# Patient Record
Sex: Female | Born: 1970 | Race: Black or African American | Hispanic: No | Smoking: Light tobacco smoker
Health system: Southern US, Community
[De-identification: ages and names within clinical notes are randomized; demographics above are authoritative.]

## PROBLEM LIST (undated history)

## (undated) DIAGNOSIS — M549 Dorsalgia, unspecified: Secondary | ICD-10-CM

## (undated) DIAGNOSIS — I10 Essential (primary) hypertension: Secondary | ICD-10-CM

## (undated) DIAGNOSIS — O139 Gestational [pregnancy-induced] hypertension without significant proteinuria, unspecified trimester: Secondary | ICD-10-CM

## (undated) DIAGNOSIS — K219 Gastro-esophageal reflux disease without esophagitis: Secondary | ICD-10-CM

## (undated) DIAGNOSIS — G8929 Other chronic pain: Secondary | ICD-10-CM

## (undated) DIAGNOSIS — Z8759 Personal history of other complications of pregnancy, childbirth and the puerperium: Secondary | ICD-10-CM

## (undated) HISTORY — DX: Personal history of other complications of pregnancy, childbirth and the puerperium: Z87.59

## (undated) HISTORY — DX: Gastro-esophageal reflux disease without esophagitis: K21.9

## (undated) HISTORY — DX: Dorsalgia, unspecified: M54.9

## (undated) HISTORY — DX: Other chronic pain: G89.29

---

## 2000-12-21 ENCOUNTER — Emergency Department (HOSPITAL_COMMUNITY): Admission: EM | Admit: 2000-12-21 | Discharge: 2000-12-21 | Payer: Self-pay | Admitting: Internal Medicine

## 2002-04-25 HISTORY — PX: BREAST SURGERY: SHX581

## 2004-04-12 ENCOUNTER — Emergency Department (HOSPITAL_COMMUNITY): Admission: EM | Admit: 2004-04-12 | Discharge: 2004-04-12 | Payer: Self-pay | Admitting: Emergency Medicine

## 2004-06-27 ENCOUNTER — Emergency Department (HOSPITAL_COMMUNITY): Admission: EM | Admit: 2004-06-27 | Discharge: 2004-06-27 | Payer: Self-pay | Admitting: Emergency Medicine

## 2011-09-13 ENCOUNTER — Other Ambulatory Visit: Payer: Self-pay | Admitting: Nurse Practitioner

## 2011-09-13 ENCOUNTER — Other Ambulatory Visit (HOSPITAL_COMMUNITY)
Admission: RE | Admit: 2011-09-13 | Discharge: 2011-09-13 | Disposition: A | Discharge status: Home / Self Care | Payer: 59 | Source: Ambulatory Visit | Attending: Obstetrics and Gynecology | Admitting: Obstetrics and Gynecology

## 2011-09-13 DIAGNOSIS — Z01419 Encounter for gynecological examination (general) (routine) without abnormal findings: Secondary | ICD-10-CM | POA: Insufficient documentation

## 2011-09-13 DIAGNOSIS — Z1159 Encounter for screening for other viral diseases: Secondary | ICD-10-CM | POA: Insufficient documentation

## 2012-09-15 ENCOUNTER — Encounter (HOSPITAL_BASED_OUTPATIENT_CLINIC_OR_DEPARTMENT_OTHER): Payer: Self-pay | Admitting: Emergency Medicine

## 2012-09-15 ENCOUNTER — Observation Stay (HOSPITAL_BASED_OUTPATIENT_CLINIC_OR_DEPARTMENT_OTHER)
Admission: EM | Admit: 2012-09-15 | Discharge: 2012-09-16 | Disposition: A | Discharge status: Home / Self Care | Payer: Medicaid Other | Attending: Internal Medicine | Admitting: Internal Medicine

## 2012-09-15 DIAGNOSIS — R002 Palpitations: Secondary | ICD-10-CM

## 2012-09-15 DIAGNOSIS — I10 Essential (primary) hypertension: Secondary | ICD-10-CM

## 2012-09-15 DIAGNOSIS — R079 Chest pain, unspecified: Secondary | ICD-10-CM

## 2012-09-15 DIAGNOSIS — Z3490 Encounter for supervision of normal pregnancy, unspecified, unspecified trimester: Secondary | ICD-10-CM

## 2012-09-15 DIAGNOSIS — O99891 Other specified diseases and conditions complicating pregnancy: Secondary | ICD-10-CM | POA: Insufficient documentation

## 2012-09-15 DIAGNOSIS — R519 Headache, unspecified: Secondary | ICD-10-CM

## 2012-09-15 DIAGNOSIS — O169 Unspecified maternal hypertension, unspecified trimester: Principal | ICD-10-CM | POA: Insufficient documentation

## 2012-09-15 DIAGNOSIS — R0602 Shortness of breath: Secondary | ICD-10-CM

## 2012-09-15 DIAGNOSIS — R51 Headache: Secondary | ICD-10-CM

## 2012-09-15 DIAGNOSIS — R350 Frequency of micturition: Secondary | ICD-10-CM

## 2012-09-15 DIAGNOSIS — K5900 Constipation, unspecified: Secondary | ICD-10-CM

## 2012-09-15 HISTORY — DX: Essential (primary) hypertension: I10

## 2012-09-15 LAB — URINALYSIS, ROUTINE W REFLEX MICROSCOPIC
Glucose, UA: NEGATIVE mg/dL
Hgb urine dipstick: NEGATIVE
Leukocytes, UA: NEGATIVE
Protein, ur: NEGATIVE mg/dL
pH: 7 (ref 5.0–8.0)

## 2012-09-15 LAB — BASIC METABOLIC PANEL
BUN: 5 mg/dL — ABNORMAL LOW (ref 6–23)
CO2: 23 mEq/L (ref 19–32)
Chloride: 100 mEq/L (ref 96–112)
Creatinine, Ser: 0.6 mg/dL (ref 0.50–1.10)
GFR calc Af Amer: >90 mL/min (ref >90)
GFR calc non Af Amer: >90 mL/min (ref >90)
Glucose, Bld: 113 mg/dL — ABNORMAL HIGH (ref 70–99)
Potassium: 3.7 mEq/L (ref 3.5–5.1)
Sodium: 136 mEq/L (ref 135–145)

## 2012-09-15 LAB — TROPONIN I: Troponin I: <0.3 ng/mL (ref <0.30)

## 2012-09-15 LAB — CBC WITH DIFFERENTIAL/PLATELET
Basophils Absolute: 0 10*3/uL (ref 0.0–0.1)
Basophils Relative: 0 % (ref 0–1)
Eosinophils Absolute: 0.3 10*3/uL (ref 0.0–0.7)
Eosinophils Relative: 4 % (ref 0–5)
Hemoglobin: 13.2 g/dL (ref 12.0–15.0)
Lymphocytes Relative: 35 % (ref 12–46)
Lymphs Abs: 2.6 10*3/uL (ref 0.7–4.0)
MCH: 29.3 pg (ref 26.0–34.0)
MCV: 83.1 fL (ref 78.0–100.0)
Monocytes Absolute: 0.7 10*3/uL (ref 0.1–1.0)
Monocytes Relative: 10 % (ref 3–12)
Neutro Abs: 3.7 10*3/uL (ref 1.7–7.7)
Neutrophils Relative %: 51 % (ref 43–77)
Platelets: 383 10*3/uL (ref 150–400)
RBC: 4.51 MIL/uL (ref 3.87–5.11)
RDW: 14.9 % (ref 11.5–15.5)
WBC: 7.3 10*3/uL (ref 4.0–10.5)

## 2012-09-15 LAB — LIPID PANEL
Cholesterol: 159 mg/dL (ref 0–200)
LDL Cholesterol: 80 mg/dL (ref 0–99)
Total CHOL/HDL Ratio: 2.9 RATIO
VLDL: 24 mg/dL (ref 0–40)

## 2012-09-15 LAB — TSH: TSH: 1.487 u[IU]/mL (ref 0.350–4.500)

## 2012-09-15 LAB — CBC
HCT: 35 % — ABNORMAL LOW (ref 36.0–46.0)
MCV: 83.1 fL (ref 78.0–100.0)
Platelets: 356 10*3/uL (ref 150–400)
RBC: 4.21 MIL/uL (ref 3.87–5.11)
WBC: 8.2 10*3/uL (ref 4.0–10.5)

## 2012-09-15 LAB — CREATININE, SERUM
GFR calc Af Amer: >90 mL/min (ref >90)
GFR calc non Af Amer: >90 mL/min (ref >90)

## 2012-09-15 LAB — PREGNANCY, URINE: Preg Test, Ur: POSITIVE — AB

## 2012-09-15 LAB — HCG, QUANTITATIVE, PREGNANCY: hCG, Beta Chain, Quant, S: 82715 m[IU]/mL — ABNORMAL HIGH (ref <5)

## 2012-09-15 LAB — PROTIME-INR
INR: 0.97 (ref 0.00–1.49)
Prothrombin Time: 12.8 seconds (ref 11.6–15.2)

## 2012-09-15 MED ORDER — PRENATAL MULTIVITAMIN CH
1.0000 | ORAL_TABLET | Freq: Every day | ORAL | Status: DC
  Administered 2012-09-15: 1 via ORAL
  Filled 2012-09-15 (×2): qty 1

## 2012-09-15 MED ORDER — METOCLOPRAMIDE HCL 5 MG/ML IJ SOLN
10.0000 mg | Freq: Once | INTRAMUSCULAR | Status: AC
  Administered 2012-09-15: 10 mg via INTRAVENOUS
  Filled 2012-09-15: qty 2

## 2012-09-15 MED ORDER — ACETAMINOPHEN 500 MG PO TABS
1000.0000 mg | ORAL_TABLET | Freq: Once | ORAL | Status: AC
  Administered 2012-09-15: 500 mg via ORAL
  Filled 2012-09-15: qty 1

## 2012-09-15 MED ORDER — WHITE PETROLATUM GEL
Status: DC | PRN
  Administered 2012-09-15: 0.2 via TOPICAL
  Filled 2012-09-15: qty 28.35

## 2012-09-15 MED ORDER — SODIUM CHLORIDE 0.9 % IV BOLUS (SEPSIS)
1000.0000 mL | Freq: Once | INTRAVENOUS | Status: AC
  Administered 2012-09-15: 1000 mL via INTRAVENOUS

## 2012-09-15 MED ORDER — METOCLOPRAMIDE HCL 10 MG PO TABS
10.0000 mg | ORAL_TABLET | Freq: Four times a day (QID) | ORAL | Status: DC | PRN
  Filled 2012-09-15: qty 1

## 2012-09-15 MED ORDER — LABETALOL HCL 200 MG PO TABS
200.0000 mg | ORAL_TABLET | Freq: Two times a day (BID) | ORAL | Status: DC
  Administered 2012-09-15 – 2012-09-16 (×3): 200 mg via ORAL
  Filled 2012-09-15 (×4): qty 1

## 2012-09-15 MED ORDER — RANITIDINE HCL 150 MG/10ML PO SYRP
300.0000 mg | ORAL_SOLUTION | Freq: Every day | ORAL | Status: DC
  Administered 2012-09-15: 300 mg via ORAL
  Filled 2012-09-15 (×2): qty 20

## 2012-09-15 MED ORDER — WHITE PETROLATUM GEL
Status: DC | PRN
  Filled 2012-09-15: qty 5

## 2012-09-15 MED ORDER — POLYETHYLENE GLYCOL 3350 17 G PO PACK
17.0000 g | PACK | Freq: Every day | ORAL | Status: DC
  Administered 2012-09-15 – 2012-09-16 (×2): 17 g via ORAL
  Filled 2012-09-15 (×2): qty 1

## 2012-09-15 MED ORDER — SODIUM CHLORIDE 0.9 % IJ SOLN
3.0000 mL | Freq: Two times a day (BID) | INTRAMUSCULAR | Status: DC
  Administered 2012-09-15 (×2): 3 mL via INTRAVENOUS

## 2012-09-15 MED ORDER — ENOXAPARIN SODIUM 40 MG/0.4ML ~~LOC~~ SOLN
40.0000 mg | SUBCUTANEOUS | Status: DC
  Administered 2012-09-15: 40 mg via SUBCUTANEOUS
  Filled 2012-09-15 (×2): qty 0.4

## 2012-09-15 MED ORDER — COMPLETENATE 29-1 MG PO CHEW
1.0000 | CHEWABLE_TABLET | Freq: Every day | ORAL | Status: DC
  Administered 2012-09-15: 1 via ORAL
  Filled 2012-09-15: qty 1

## 2012-09-15 MED ORDER — DIPHENHYDRAMINE HCL 50 MG/ML IJ SOLN
12.5000 mg | Freq: Once | INTRAMUSCULAR | Status: AC
  Administered 2012-09-15: 12.5 mg via INTRAVENOUS
  Filled 2012-09-15: qty 1

## 2012-09-15 MED ORDER — ACETAMINOPHEN 325 MG PO TABS
650.0000 mg | ORAL_TABLET | ORAL | Status: DC | PRN
  Administered 2012-09-15: 650 mg via ORAL
  Filled 2012-09-15: qty 2

## 2012-09-15 NOTE — Progress Notes (Signed)
Request for transfer to [facility], chest pain rule out and headache, pt with HTN, [redacted] weeks pregnant. Pt has no PCP or OB/GYN. Asked for telemetry bed.   Debbora Presto, MD  [facility] Pager [phone]  If 7PM-7AM, please contact night-coverage [email or web address] Password [email or web address]

## 2012-09-15 NOTE — H&P (Addendum)
[facility] History and Physical  Jacqueline Rowe ZOX:[id number] DOB: 16-Feb-1971 DOA: 09/15/2012  Referring physician:  April Palumbo-Rasch PCP:  No primary provider on file.   Chief Complaint:  Headache,  Left face pain, and palpations  HPI:  The patient is a 42 y.o. year-old female with history of hypertension who presents with episodic headache, left face pain, chest pains and palpitations.  The patient was last at their baseline health 2 weeks ago.  The patient states that about 3 weeks ago she found out that she was pregnant. At that time she stopped her Diovan and did not take any other blood pressure medications because she does not have a primary care doctor. She started having intermittent posterior head headaches which she attributed to high blood pressure. When she would get a headache she would take a dose of Diovan and her headache would resolve.  Yesterday, her headache returned so she took some tylenol.  Her headache went away for about 30 minutes then returned worse than before 10/10 with shortness of breath, nausea, but no diaphoresis.  She then took the diovan and her headache resolved.  Overnight, she was woken up from sleep with 10/10 sharp pains on the left side of the head that radiated down her left neck and left shoulder.  Denies associated chest pain, SOB, nausea, but did have diaphoresis and palpitations.  She went to the emergency department where she was given IVF and benadryl and she is being admitted to rule out ACS.    Review of Systems:  Denies fevers, chills, weight loss or gain, changes to hearing and vision.  Denies rhinorrhea, sinus congestion, sore throat.  Denies cough.  Denies vomiting, diarrhea.  Denies dysuria.  Has urinary frequency.  Denies urgency, polyuria, polydipsia.  Denies hematemesis, blood in stools, melena, abnormal bruising or bleeding.  Denies lymphadenopathy.  Denies arthralgias, myalgias.  Denies skin rash or ulcer.  Denies lower extremity  edema.  Denies focal numbness, weakness, slurred speech, confusion, facial droop.  Denies anxiety and depression.    Past Medical History  Diagnosis Date  . Hypertension    Past Surgical History  Procedure Laterality Date  . Orif radial fracture     Social History:  reports that she quit smoking about 3 weeks ago. Her smoking use included Cigarettes. She has a 3.75 pack-year smoking history. She does not have any smokeless tobacco history on file. She reports that  drinks alcohol. She reports that she does not use illicit drugs.  Lives in an apartment with her fiance and dog.  Does not work, currently unemployed.  Collections/medical billing/customer service previously.    No Known Allergies  Family History  Problem Relation Age of Onset  . Migraines Neg Hx   . High blood pressure Mother   . High blood pressure Maternal Aunt   . High blood pressure Sister      Prior to Admission medications   Medication Sig Start Date End Date Taking? Authorizing Provider  valsartan (DIOVAN) 160 MG tablet Take 160 mg by mouth daily.   Yes Historical Provider, MD   Physical Exam: Filed Vitals:   09/15/12 0631 09/15/12 0829 09/15/12 1122 09/15/12 1355  BP:  122/49 151/65 118/88  Pulse:  75 73 76  Temp: 98.2 F (36.8 C)  98.4 F (36.9 C) 98.8 F (37.1 C)  TempSrc:   Oral Oral  Resp:  17 18 20   Height:   5\' 7"  (1.702 m)   Weight:   121.4 kg (267 lb  10.2 oz)   SpO2:  100% 100% 98%     General:  African American female, no acute distress  Eyes:  PERRL, anicteric, non-injected.  ENT:  Nares clear.  OP clear, non-erythematous without plaques or exudates.  MMM.  Neck:  Supple without TM or JVD.    Lymph:  No cervical, supraclavicular, or submandibular LAD.  Cardiovascular:  RRR, normal S1, S2, without m/r/g.  2+ pulses, warm extremities  Respiratory:  CTA bilaterally without increased WOB.  Abdomen:  NABS.  Soft, ND/NT.    Skin:  No rashes or focal lesions.  No vesicles on the left  face, shoulder or neck  Musculoskeletal:  Normal bulk and tone.  No LE edema.  Psychiatric:  A & O x 4.  Appropriate affect.  Neurologic:  CN 3-12 intact.  5/5 strength.  Sensation intact.  Labs on Admission:  Basic Metabolic Panel:  Recent Labs Lab 09/15/12 0550 09/15/12 1341  NA 136  --   K 3.7  --   CL 100  --   CO2 23  --   GLUCOSE 113*  --   BUN 5*  --   CREATININE 0.60 0.58  CALCIUM 9.4  --    Liver Function Tests: No results found for this basename: AST, ALT, ALKPHOS, BILITOT, PROT, ALBUMIN,  in the last 168 hours No results found for this basename: LIPASE, AMYLASE,  in the last 168 hours No results found for this basename: AMMONIA,  in the last 168 hours CBC:  Recent Labs Lab 09/15/12 0550 09/15/12 1341  WBC 7.3 8.2  NEUTROABS 3.7  --   HGB 13.2 12.3  HCT 37.5 35.0*  MCV 83.1 83.1  PLT 383 356   Cardiac Enzymes:  Recent Labs Lab 09/15/12 0550 09/15/12 1345  TROPONINI <0.30 <0.30    BNP (last 3 results) No results found for this basename: PROBNP,  in the last 8760 hours CBG: No results found for this basename: GLUCAP,  in the last 168 hours  Radiological Exams on Admission: No results found.  EKG: Independently reviewed. Normal sinus rhythm with isolated T-wave inversion in lead 3  Assessment/Plan Active Problems:   HTN (hypertension)   Headache   Pregnancy   Urinary frequency   Palpitations   Shortness of breath  Headache:  Differential diagnosis includes tension headaches, migraine headache, cervical spine nerve impingement.  Pain improved after Reglan and Benadryl -  Per OB/GYN Reglan and Benadryl are okay for headache treatment -  Continue Tylenol and Reglan when necessary  Palpitations and shortness of breath: -  EKG:  Normal sinus rhythm -  Telemetry -  Troponins -  D-dimer: Negative -  TSH -  Lipid panel -  Hemoglobin A1c  Urinary frequency, likely benign from pregnancy -  Urinalysis and urine culture  Hypertension,  blood pressures elevated -  Start labetalol -  Continue Diovan  Pregnancy -  Start multivitamin  Constipation  -  Start Colace and MiraLax when necessary   Diet:  Healthy heart Access:  PIV IVF:  OFF Proph:  lovenox  Code Status: full Family Communication: spoke with patient and fiance Disposition Plan: Likely home tomorrow  Time spent: 60 minutes  Renae Fickle [facility] Pager [phone]  If 7PM-7AM, please contact night-coverage [email or web address] Password [facility] 09/15/2012, 6:25 PM

## 2012-09-15 NOTE — ED Notes (Signed)
Pt reports she was awaken from sleep by shooting pain in the left side of head radating down to her chest. Pt states she is 2 months nine-weeks preg and stop taking her BP med d/t pregnancy

## 2012-09-15 NOTE — Progress Notes (Signed)
Pt c/o of headache dull and behind eyes, requested Tylenol. Reports H/A not like the one that brought [facility]. B/P 118/88

## 2012-09-15 NOTE — ED Notes (Signed)
I met patient at waiting room, patient complained of sharp neck and head pain running down left arm. I took patient to room, got vitals and completed an ecg, notified nurse.

## 2012-09-15 NOTE — Plan of Care (Signed)
Problem: Phase I Progression Outcomes Goal: Anginal pain relieved Outcome: Completed/Met Date Met:  09/15/12 C/o chest pain shortly after MD assessment completed. Pt presents in NAD. Talking calmly and laughing with phlebotomist who is drawing blood. B/P 118/88 HR76  EKG with no changes and WNL in comparison to previous EKG. Upon return to room pt reports just a slight twinge in LUC.

## 2012-09-15 NOTE — ED Provider Notes (Addendum)
History     CSN: [id number]  Arrival date & time 09/15/12  0509   First MD Initiated Contact with Patient 09/15/12 [phone]      Chief Complaint  Patient presents with  . Headache    with arm and neck pain    (Consider location/radiation/quality/duration/timing/severity/associated sxs/prior treatment) Patient is a 42 y.o. female presenting with headaches and chest pain. The history is provided by the patient.  Headache Pain location:  L temporal Quality:  Stabbing Radiates to:  L neck and L arm Severity currently:  6/10 Severity at highest:  6/10 Timing:  Constant Progression:  Improving Chronicity:  New Context: not defecating   Relieved by:  Nothing Worsened by:  Nothing tried Ineffective treatments:  None tried Associated symptoms: nausea   Associated symptoms: no abdominal pain, no ear pain, no fever, no loss of balance, no neck stiffness, no paresthesias, no photophobia, no sore throat and no weakness   Risk factors: no family hx of SAH   Chest Pain Pain location:  L chest Pain radiates to:  L arm Pain radiates to the back: no   Pain severity:  Moderate Onset quality:  Sudden Timing:  Constant Progression:  Unchanged Chronicity:  New Context: at rest   Relieved by:  Nothing Worsened by:  Nothing tried Ineffective treatments:  None tried Associated symptoms: headache, nausea and shortness of breath   Associated symptoms: no abdominal pain, no fever and no palpitations   Risk factors: smoking     History reviewed. No pertinent past medical history.  No past surgical history on file.  No family history on file.  History  Substance Use Topics  . Smoking status: Not on file  . Smokeless tobacco: Not on file  . Alcohol Use: Not on file    OB History   Grav Para Term Preterm Abortions TAB SAB Ect Mult Living                  Review of Systems  Constitutional: Negative for fever.  HENT: Negative for ear pain, sore throat and neck stiffness.   Eyes:  Negative for photophobia.  Respiratory: Positive for shortness of breath.   Cardiovascular: Positive for chest pain. Negative for palpitations and leg swelling.  Gastrointestinal: Positive for nausea. Negative for abdominal pain.  Neurological: Positive for headaches. Negative for paresthesias and loss of balance.  All other systems reviewed and are negative.   Date: 09/15/2012  Rate: 77  Rhythm: normal sinus rhythm  QRS Axis: normal  Intervals: normal  ST/T Wave abnormalities: normal  Conduction Disutrbances: none  Narrative Interpretation: unremarkable      Allergies  Review of patient's allergies indicates not on file.  Home Medications  No current outpatient prescriptions on file.  BP 157/74  Pulse 88  Temp(Src) 98.4 F (36.9 C) (Oral)  Resp 20  SpO2 100%  Physical Exam  Constitutional: She is oriented to person, place, and time. She appears well-developed and well-nourished. No distress.  HENT:  Head: Normocephalic and atraumatic.  Right Ear: No mastoid tenderness. No hemotympanum.  Left Ear: No mastoid tenderness. No hemotympanum.  Mouth/Throat: Oropharynx is clear and moist.  Eyes: Conjunctivae and EOM are normal. Pupils are equal, round, and reactive to light.  Neck: Normal range of motion. Neck supple. No JVD present. No tracheal deviation present.  Cardiovascular: Normal rate, regular rhythm and intact distal pulses.   Pulmonary/Chest: Effort normal and breath sounds normal. No stridor. She has no wheezes. She has no rales. She exhibits  no tenderness.  Abdominal: Soft. Bowel sounds are normal. There is no tenderness. There is no rebound and no guarding.  Musculoskeletal: Normal range of motion. She exhibits no edema and no tenderness.  Neurological: She is alert and oriented to person, place, and time. She has normal reflexes. She displays normal reflexes. No cranial nerve deficit. Coordination normal.  5/5 strength throughout  Skin: Skin is warm and dry.   Psychiatric: She has a normal mood and affect.    ED Course  Procedures (including critical care time)  Labs Reviewed  CBC WITH DIFFERENTIAL  BASIC METABOLIC PANEL  TROPONIN I  PROTIME-INR  PREGNANCY, URINE  HCG, QUANTITATIVE, PREGNANCY   No results found.   No diagnosis found.    MDM  Case d/w Dr. Thad Ranger who given the symptoms are all unilateral would not CT head at this time. Dissection in the neck would have contralateral symptoms.  Without neurologic deficits patient can be watched as the risk of pathology is low.   If symptoms persist or patient is risk averse MRI of the brain.  Patient adamantly refusing all imaging at this time.  Patient's nurse Reita Cliche present.    Patient reports thinking BP was elevated so she took a diaovan at home and symptoms are improving   Case d/w Dr, Janee Morn of GYN reglan and benadryl are same for treatment of migraine can consider flexeril or fiorcet    Date: 09/15/2012  Rate: 77  Rhythm: normal sinus rhythm  QRS Axis: normal  Intervals: normal  ST/T Wave abnormalities: normal  Conduction Disutrbances: none  Narrative Interpretation: unremarkable    Patient admitted to medicine for rule out  Alyrica Thurow K Kaylee Trivett-Rasch, MD 09/15/12 0707  Jasmine Awe, MD 09/15/12 [phone]

## 2012-09-16 LAB — TROPONIN I: Troponin I: <0.3 ng/mL (ref <0.30)

## 2012-09-16 LAB — URINE CULTURE: Colony Count: 55000

## 2012-09-16 MED ORDER — LABETALOL HCL 200 MG PO TABS: 200.0000 mg | ORAL_TABLET | Freq: Two times a day (BID) | ORAL | Status: DC

## 2012-09-16 MED ORDER — POLYETHYLENE GLYCOL 3350 17 G PO PACK: 17.0000 g | PACK | Freq: Every day | ORAL | Status: DC

## 2012-09-16 MED ORDER — METOCLOPRAMIDE HCL 10 MG PO TABS: 10.0000 mg | ORAL_TABLET | Freq: Four times a day (QID) | ORAL | Status: DC | PRN

## 2012-09-16 MED ORDER — PRENATAL MULTIVITAMIN CH: 1.0000 | ORAL_TABLET | Freq: Every day | ORAL | Status: DC

## 2012-09-16 NOTE — Discharge Summary (Signed)
Physician Discharge Summary  Jacqueline Rowe ZOX:[id number] DOB: 01-17-1971 DOA: 09/15/2012  PCP: No primary provider on file.  Admit date: 09/15/2012 Discharge date: 09/16/2012  Recommendations for Outpatient Follow-up:  1. Followup with obstetrician in one week.  Discharge Diagnoses:  Active Problems:   HTN (hypertension)   Headache   Pregnancy   Urinary frequency   Palpitations   Shortness of breath   Unspecified constipation   Discharge Condition: Stable, improved  Diet recommendation: Healthy heart  Wt Readings from Last 3 Encounters:  09/16/12 122 kg (268 lb 15.4 oz)    History of present illness:  The patient is a 42 y.o. year-old female with history of hypertension who presents with episodic headache, left face pain, chest pains and palpitations. The patient was last at their baseline health 2 weeks ago. The patient states that about 3 weeks ago she found out that she was pregnant. At that time she stopped her Diovan and did not take any other blood pressure medications because she does not have a primary care doctor. She started having intermittent posterior head headaches which she attributed to high blood pressure. When she would get a headache she would take a dose of Diovan and her headache would resolve. Yesterday, her headache returned so she took some tylenol. Her headache went away for about 30 minutes then returned worse than before 10/10 with shortness of breath, nausea, but no diaphoresis. She then took the diovan and her headache resolved. Overnight, she was woken up from sleep with 10/10 sharp pains on the left side of the head that radiated down her left neck and left shoulder. Denies associated chest pain, SOB, nausea, but did have diaphoresis and palpitations. She went to the emergency department where she was given IVF and benadryl and she is being admitted to rule out ACS.   Hospital Course:   Headache: Differential diagnosis includes tension headaches,  migraine headache, cervical spine nerve impingement.  The case was discussed with neurology.  They did not recommend any further testing or imaging. Pain improved after Reglan and Benadryl.  She will be given a prescription for Reglan to use as needed for headache at home.    Palpitations and shortness of breath:   - EKG: Normal sinus rhythm  - Telemetry:  NSR - Troponins:  Neg x 3 - D-dimer: Negative  - TSH 1.487 - Lipid panel, cholesterol at goal - Hemoglobin A1c 5.7  Urinary frequency, likely benign from pregnancy  - Urinalysis negative and urine culture is pending  Hypertension, blood pressures elevated.  Recommended that she discontinue her Diovan and start labetalol.  Her blood pressures were within normal limits to slightly elevated on twice a day labetalol.  She may need to have her dose adjusted by her obstetrician in one to 2 weeks.  If able she should check her blood pressure as an outpatient and record the readings and bring with her to her followup appointment.   Pregnancy.  She was started on a prenatal vitamin.  Constipation.  Recommended Colace and MiraLax.  Procedures:  None  Consultations:  Case discussed with obstetrician and neurologist  Discharge Exam: Filed Vitals:   09/16/12 0621  BP: 125/54  Pulse: 69  Temp: 98.2 F (36.8 C)  Resp: 18   Filed Vitals:   09/15/12 1122 09/15/12 1355 09/15/12 2133 09/16/12 0621  BP: 151/65 118/88 136/64 125/54  Pulse: 73 76 66 69  Temp: 98.4 F (36.9 C) 98.8 F (37.1 C) 98.7 F (37.1 C) 98.2 F (36.8  C)  TempSrc: Oral Oral Oral Oral  Resp: 18 20 18 18   Height: 5\' 7"  (1.702 m)     Weight: 121.4 kg (267 lb 10.2 oz)   122 kg (268 lb 15.4 oz)  SpO2: 100% 98% 100% 100%   Patient states that her chest pain and headache resolved.  Did not have any palpitations.  General: African American female, no acute distress  HEENT: NCAT, MMM  Cardiovascular: RRR, normal S1, S2, without m/r/g. 2+ pulses, warm extremities   Respiratory: CTA bilaterally without increased WOB.  Abdomen: NABS. Soft, ND/NT.  Skin: No rashes or focal lesions. No vesicles on the left face, shoulder or neck  Musculoskeletal: Normal bulk and tone. No LE edema.    Discharge Instructions     Medication List    STOP taking these medications       valsartan 160 MG tablet  Commonly known as:  DIOVAN      TAKE these medications       labetalol 200 MG tablet  Commonly known as:  NORMODYNE  Take 1 tablet (200 mg total) by mouth 2 (two) times daily.     metoCLOPramide 10 MG tablet  Commonly known as:  REGLAN  Take 1 tablet (10 mg total) by mouth every 6 (six) hours as needed (headache).     polyethylene glycol packet  Commonly known as:  MIRALAX / GLYCOLAX  Take 17 g by mouth daily.     prenatal multivitamin Tabs  Take 1 tablet by mouth daily at 12 noon.     ranitidine 300 MG tablet  Commonly known as:  ZANTAC  Take 300 mg by mouth at bedtime.       Follow-up Information   Follow up with Obstetrician.   Contact information:   Please call the clinic you have chosen to schedule an appointment for as soon as possible.        The results of significant diagnostics from this hospitalization (including imaging, microbiology, ancillary and laboratory) are listed below for reference.    Significant Diagnostic Studies: No results found.  Microbiology: No results found for this or any previous visit (from the past 240 hour(s)).   Labs: Basic Metabolic Panel:  Recent Labs Lab 09/15/12 0550 09/15/12 1341  NA 136  --   K 3.7  --   CL 100  --   CO2 23  --   GLUCOSE 113*  --   BUN 5*  --   CREATININE 0.60 0.58  CALCIUM 9.4  --    Liver Function Tests: No results found for this basename: AST, ALT, ALKPHOS, BILITOT, PROT, ALBUMIN,  in the last 168 hours No results found for this basename: LIPASE, AMYLASE,  in the last 168 hours No results found for this basename: AMMONIA,  in the last 168 hours CBC:  Recent  Labs Lab 09/15/12 0550 09/15/12 1341  WBC 7.3 8.2  NEUTROABS 3.7  --   HGB 13.2 12.3  HCT 37.5 35.0*  MCV 83.1 83.1  PLT 383 356   Cardiac Enzymes:  Recent Labs Lab 09/15/12 0550 09/15/12 1345 09/15/12 1829 09/16/12 0012  TROPONINI <0.30 <0.30 <0.30 <0.30   BNP: BNP (last 3 results) No results found for this basename: PROBNP,  in the last 8760 hours CBG: No results found for this basename: GLUCAP,  in the last 168 hours  Time coordinating discharge: 45 minutes  Signed:  Iasiah Ozment  [facility] 09/16/2012, 7:56 AM

## 2013-11-20 ENCOUNTER — Encounter (HOSPITAL_BASED_OUTPATIENT_CLINIC_OR_DEPARTMENT_OTHER): Payer: Self-pay | Admitting: Emergency Medicine

## 2013-11-20 ENCOUNTER — Emergency Department (HOSPITAL_BASED_OUTPATIENT_CLINIC_OR_DEPARTMENT_OTHER): Payer: Commercial Managed Care - PPO

## 2013-11-20 ENCOUNTER — Emergency Department (HOSPITAL_BASED_OUTPATIENT_CLINIC_OR_DEPARTMENT_OTHER)
Admission: EM | Admit: 2013-11-20 | Discharge: 2013-11-20 | Disposition: A | Discharge status: Home / Self Care | Payer: Commercial Managed Care - PPO | Attending: Emergency Medicine | Admitting: Emergency Medicine

## 2013-11-20 DIAGNOSIS — R1012 Left upper quadrant pain: Secondary | ICD-10-CM | POA: Insufficient documentation

## 2013-11-20 DIAGNOSIS — O99330 Smoking (tobacco) complicating pregnancy, unspecified trimester: Secondary | ICD-10-CM | POA: Insufficient documentation

## 2013-11-20 DIAGNOSIS — O9989 Other specified diseases and conditions complicating pregnancy, childbirth and the puerperium: Secondary | ICD-10-CM | POA: Insufficient documentation

## 2013-11-20 DIAGNOSIS — Z79899 Other long term (current) drug therapy: Secondary | ICD-10-CM | POA: Insufficient documentation

## 2013-11-20 DIAGNOSIS — O169 Unspecified maternal hypertension, unspecified trimester: Secondary | ICD-10-CM | POA: Diagnosis not present

## 2013-11-20 DIAGNOSIS — R109 Unspecified abdominal pain: Secondary | ICD-10-CM

## 2013-11-20 DIAGNOSIS — Z3490 Encounter for supervision of normal pregnancy, unspecified, unspecified trimester: Secondary | ICD-10-CM

## 2013-11-20 LAB — COMPREHENSIVE METABOLIC PANEL
ALK PHOS: 57 U/L (ref 39–117)
ALT: 10 U/L (ref 0–35)
AST: 14 U/L (ref 0–37)
Albumin: 3.2 g/dL — ABNORMAL LOW (ref 3.5–5.2)
Anion gap: 12 (ref 5–15)
BILIRUBIN TOTAL: 0.2 mg/dL — AB (ref 0.3–1.2)
BUN: 5 mg/dL — ABNORMAL LOW (ref 6–23)
CHLORIDE: 102 meq/L (ref 96–112)
CO2: 25 meq/L (ref 19–32)
Calcium: 9.2 mg/dL (ref 8.4–10.5)
Creatinine, Ser: 0.9 mg/dL (ref 0.50–1.10)
GFR calc Af Amer: >90 mL/min (ref >90)
GFR, EST NON AFRICAN AMERICAN: 78 mL/min — AB (ref >90)
GLUCOSE: 122 mg/dL — AB (ref 70–99)
Potassium: 4.1 mEq/L (ref 3.7–5.3)
SODIUM: 139 meq/L (ref 137–147)
Total Protein: 7.4 g/dL (ref 6.0–8.3)

## 2013-11-20 LAB — URINALYSIS, ROUTINE W REFLEX MICROSCOPIC
BILIRUBIN URINE: NEGATIVE
GLUCOSE, UA: NEGATIVE mg/dL
HGB URINE DIPSTICK: NEGATIVE
Ketones, ur: NEGATIVE mg/dL
Leukocytes, UA: NEGATIVE
Nitrite: NEGATIVE
Protein, ur: NEGATIVE mg/dL
Specific Gravity, Urine: 1.024 (ref 1.005–1.030)
Urobilinogen, UA: 0.2 mg/dL (ref 0.0–1.0)
pH: 5.5 (ref 5.0–8.0)

## 2013-11-20 LAB — CBC WITH DIFFERENTIAL/PLATELET
Basophils Absolute: 0 10*3/uL (ref 0.0–0.1)
Basophils Relative: 1 % (ref 0–1)
EOS PCT: 4 % (ref 0–5)
Eosinophils Absolute: 0.2 10*3/uL (ref 0.0–0.7)
HCT: 34.9 % — ABNORMAL LOW (ref 36.0–46.0)
Hemoglobin: 11.6 g/dL — ABNORMAL LOW (ref 12.0–15.0)
LYMPHS ABS: 2 10*3/uL (ref 0.7–4.0)
Lymphocytes Relative: 38 % (ref 12–46)
MCH: 26.8 pg (ref 26.0–34.0)
MCHC: 33.2 g/dL (ref 30.0–36.0)
MCV: 80.6 fL (ref 78.0–100.0)
Monocytes Absolute: 0.5 10*3/uL (ref 0.1–1.0)
Monocytes Relative: 10 % (ref 3–12)
Neutro Abs: 2.6 10*3/uL (ref 1.7–7.7)
Neutrophils Relative %: 48 % (ref 43–77)
Platelets: 409 10*3/uL — ABNORMAL HIGH (ref 150–400)
RBC: 4.33 MIL/uL (ref 3.87–5.11)
RDW: 15 % (ref 11.5–15.5)
WBC: 5.4 10*3/uL (ref 4.0–10.5)

## 2013-11-20 LAB — PREGNANCY, URINE: Preg Test, Ur: POSITIVE — AB

## 2013-11-20 LAB — HCG, QUANTITATIVE, PREGNANCY: hCG, Beta Chain, Quant, S: 164 m[IU]/mL — ABNORMAL HIGH (ref <5)

## 2013-11-20 MED ORDER — ONDANSETRON HCL 4 MG/2ML IJ SOLN
4.0000 mg | Freq: Once | INTRAMUSCULAR | Status: AC
Start: 2013-11-20 — End: 2013-11-20
  Administered 2013-11-20: 4 mg via INTRAVENOUS
  Filled 2013-11-20: qty 2

## 2013-11-20 MED ORDER — MORPHINE SULFATE 4 MG/ML IJ SOLN
4.0000 mg | INTRAMUSCULAR | Status: DC | PRN
  Filled 2013-11-20: qty 1

## 2013-11-20 NOTE — ED Notes (Signed)
Pt is unsure if she can get a ride home, declines morphine until she is sure her husband can pick her up. Pt given call bell and states she will call this rn when she finds out if she can get a ride home.

## 2013-11-20 NOTE — ED Notes (Signed)
Pt c/o pain in left side, N/V, started 3 days ago. Also c/o "facial tremors" that she states started this morning.

## 2013-11-20 NOTE — ED Notes (Signed)
MD at bedside. 

## 2013-11-20 NOTE — ED Provider Notes (Signed)
CSN: [id number]     Arrival date & time 11/20/13  1026 History   First MD Initiated Contact with Patient 11/20/13 1037     Chief Complaint  Patient presents with  . Abdominal Pain      HPI  Jacqueline Rowe presents with abdominal pain. Primarily left upper lateral abdominal pain well localized. States Jacqueline Rowe feels "a lump" in her abdominal wall has become more painful. It is constantly present not intermittent. Has had nausea and vomiting starting in the mornings 2 days ago but none today. A normal breakfast today. No vaginal bleeding or discharge. Last menstrual period 2 weeks ago. G1 P0 had a previous premature birth at 6 months last year. Child did not survive.  Past Medical History  Diagnosis Date  . Hypertension    History reviewed. No pertinent past surgical history. History reviewed. No pertinent family history. History  Substance Use Topics  . Smoking status: Current Every Day Smoker  . Smokeless tobacco: Not on file  . Alcohol Use: Yes     Comment: ocassional   OB History   Grav Para Term Preterm Abortions TAB SAB Ect Mult Living                 Review of Systems  Constitutional: Negative for fever, chills, diaphoresis, appetite change and fatigue.  HENT: Negative for mouth sores, sore throat and trouble swallowing.   Eyes: Negative for visual disturbance.  Respiratory: Negative for cough, chest tightness, shortness of breath and wheezing.   Cardiovascular: Negative for chest pain.  Gastrointestinal: Positive for nausea, vomiting and abdominal pain. Negative for diarrhea and abdominal distention.  Endocrine: Negative for polydipsia, polyphagia and polyuria.  Genitourinary: Negative for dysuria, frequency and hematuria.  Musculoskeletal: Negative for gait problem.  Skin: Negative for color change, pallor and rash.  Neurological: Negative for dizziness, syncope, light-headedness and headaches.  Hematological: Does not bruise/bleed easily.  Psychiatric/Behavioral: Negative for  behavioral problems and confusion.      Allergies  Review of patient's allergies indicates no known allergies.  Home Medications   Prior to Admission medications   Medication Sig Start Date End Date Taking? Authorizing Provider  cloNIDine (CATAPRES) 0.1 MG tablet Take 0.1 mg by mouth 2 (two) times daily.   Yes Historical Provider, MD  labetalol (NORMODYNE) 100 MG tablet Take 100 mg by mouth 2 (two) times daily.   Yes Historical Provider, MD  ranitidine (ZANTAC) 150 MG tablet Take 150 mg by mouth 2 (two) times daily.   Yes Historical Provider, MD   BP 167/107  Pulse 92  Temp(Src) 98.4 F (36.9 C) (Oral)  Resp 18  SpO2 100%  LMP 10/28/2013 Physical Exam  Constitutional: Jacqueline Rowe is oriented to person, place, and time. Jacqueline Rowe appears well-developed and well-nourished. No distress.  HENT:  Head: Normocephalic.  Eyes: Conjunctivae are normal. Pupils are equal, round, and reactive to light. No scleral icterus.  Neck: Normal range of motion. Neck supple. No thyromegaly present.  Cardiovascular: Normal rate and regular rhythm.  Exam reveals no gallop and no friction rub.   No murmur heard. Pulmonary/Chest: Effort normal and breath sounds normal. No respiratory distress. Jacqueline Rowe has no wheezes. Jacqueline Rowe has no rales.  Abdominal: Soft. Bowel sounds are normal. Jacqueline Rowe exhibits no distension. There is no tenderness. There is no rebound.    Musculoskeletal: Normal range of motion.  Neurological: Jacqueline Rowe is alert and oriented to person, place, and time.  Skin: Skin is warm and dry. No rash noted.  Psychiatric: Jacqueline Rowe has a normal mood  and affect. Her behavior is normal.    ED Course  Procedures (including critical care time) Labs Review Labs Reviewed  URINALYSIS, ROUTINE W REFLEX MICROSCOPIC - Abnormal; Notable for the following:    APPearance CLOUDY (*)    All other components within normal limits  PREGNANCY, URINE - Abnormal; Notable for the following:    Preg Test, Ur POSITIVE (*)    All other components  within normal limits  COMPREHENSIVE METABOLIC PANEL - Abnormal; Notable for the following:    Glucose, Bld 122 (*)    BUN 5 (*)    Albumin 3.2 (*)    Total Bilirubin 0.2 (*)    GFR calc non Af Amer 78 (*)    All other components within normal limits  CBC WITH DIFFERENTIAL - Abnormal; Notable for the following:    Hemoglobin 11.6 (*)    HCT 34.9 (*)    Platelets 409 (*)    All other components within normal limits  HCG, QUANTITATIVE, PREGNANCY - Abnormal; Notable for the following:    hCG, Beta Chain, Quant, S 164 (*)    All other components within normal limits  LIPASE, BLOOD    Imaging Review Dg Chest 2 View  11/20/2013   CLINICAL DATA:  LEFT upper abdominal pain for 3 days, history hypertension  EXAM: CHEST  2 VIEW  COMPARISON:  None  FINDINGS: Normal heart size, mediastinal contours, and pulmonary vascularity.  Lungs clear.  No pleural effusion or pneumothorax.  Bones unremarkable.  Visualized upper abdomen normal appearance.  IMPRESSION: Normal exam.   Electronically Signed   By: Ulyses Southward M.D.   On: 11/20/2013 11:22   US Ob Comp Less 14 Wks  11/20/2013   CLINICAL DATA:  LEFT upper quadrant pain, positive pregnancy test, had pre term labor last year ; quantitative beta HCG = 164  EXAM: OBSTETRIC <14 WK [place] AND TRANSVAGINAL OB US  TECHNIQUE: Both transabdominal and transvaginal ultrasound examinations were performed for complete evaluation of the gestation as well as the maternal uterus, adnexal regions, and pelvic cul-de-sac. Transvaginal technique was performed to assess early pregnancy. Transabdominal images severely limited by inadequate bladder distention and the suboptimal acoustic window.  COMPARISON:  None  FINDINGS: Intrauterine gestational sac: Not identified  Yolk sac:  N/A  Embryo:  N/A  Cardiac Activity: N/A  Heart Rate:  N/A bpm  Maternal uterus/adnexae:  No intrauterine gestational sac, at hemorrhage or endometrial fluid.  Nabothian cyst at cervix.  Tiny probable uterine  leiomyoma 7 x 8 x 5 mm.  LEFT ovary normal size and morphology, 2.4 x 2.9 x 2.1 cm.  RIGHT ovary not identified on either transabdominal or endovaginal imaging question related to obscuration by bowel.  No free pelvic fluid or adnexal masses.  IMPRESSION: Pregnancy of unknown location with no intrauterine or extrauterine gestation identified.  Probable tiny uterine leiomyoma 8 mm diameter.  At a quantitative beta HCG level of 164, differential diagnosis includes early intrauterine pregnancy too early to visualize, spontaneous abortion and ectopic pregnancy.  Serial quantitative beta HCG and or followup ultrasound recommended to definitively exclude ectopic pregnancy.   Electronically Signed   By: Ulyses Southward M.D.   On: 11/20/2013 14:14   US Ob Transvaginal  11/20/2013   CLINICAL DATA:  LEFT upper quadrant pain, positive pregnancy test, had pre term labor last year ; quantitative beta HCG = 164  EXAM: OBSTETRIC <14 WK [place] AND TRANSVAGINAL OB US  TECHNIQUE: Both transabdominal and transvaginal ultrasound examinations were performed for  complete evaluation of the gestation as well as the maternal uterus, adnexal regions, and pelvic cul-de-sac. Transvaginal technique was performed to assess early pregnancy. Transabdominal images severely limited by inadequate bladder distention and the suboptimal acoustic window.  COMPARISON:  None  FINDINGS: Intrauterine gestational sac: Not identified  Yolk sac:  N/A  Embryo:  N/A  Cardiac Activity: N/A  Heart Rate:  N/A bpm  Maternal uterus/adnexae:  No intrauterine gestational sac, at hemorrhage or endometrial fluid.  Nabothian cyst at cervix.  Tiny probable uterine leiomyoma 7 x 8 x 5 mm.  LEFT ovary normal size and morphology, 2.4 x 2.9 x 2.1 cm.  RIGHT ovary not identified on either transabdominal or endovaginal imaging question related to obscuration by bowel.  No free pelvic fluid or adnexal masses.  IMPRESSION: Pregnancy of unknown location with no intrauterine or  extrauterine gestation identified.  Probable tiny uterine leiomyoma 8 mm diameter.  At a quantitative beta HCG level of 164, differential diagnosis includes early intrauterine pregnancy too early to visualize, spontaneous abortion and ectopic pregnancy.  Serial quantitative beta HCG and or followup ultrasound recommended to definitively exclude ectopic pregnancy.   Electronically Signed   By: Ulyses SouthwardMark  Boles M.D.   On: 11/20/2013 14:14     EKG Interpretation None      MDM   Final diagnoses:  Pregnancy  Abdominal wall pain    No pregnancy identified on ultrasound. Quant only 164. [facility] . He did tell her that it is recommended Jacqueline Rowe have a repeat quantitative hCG in 48-72 hours. Recheck sooner with worsening pain bleeding or other symptoms.    Rolland PorterMark Triston Lisanti, MD 11/20/13 [phone]

## 2013-11-20 NOTE — ED Notes (Signed)
Pt states that she is very happy that her preg test is positive, and she declines any pain medication at this time. Pt talking on phone to her husband at this time.

## 2013-11-20 NOTE — Discharge Instructions (Signed)
Prenatal Care  WHAT IS PRENATAL CARE?  Prenatal care means health care during your pregnancy, before your baby is born. It is very important to take care of yourself and your baby during your pregnancy by:   Getting early prenatal care. If you know you are pregnant, or think you might be pregnant, call your health care provider as soon as possible. Schedule a visit for a prenatal exam.  Getting regular prenatal care. Follow your health care provider's schedule for blood and other necessary tests. Do not miss appointments.  Doing everything you can to keep yourself and your baby healthy during your pregnancy.  Getting complete care. Prenatal care should include evaluation of the medical, dietary, educational, psychological, and social needs of you and your significant other. The medical and genetic history of your family and the family of your baby's father should be discussed with your health care provider.  Discussing with your health care provider:  Prescription, over-the-counter, and herbal medicines that you take.  Any history of substance abuse, alcohol use, smoking, and illegal drug use.  Any history of domestic abuse and violence.  Immunizations you have received.  Your nutrition and diet.  The amount of exercise you do.  Any environmental and occupational hazards to which you are exposed.  History of sexually transmitted infections for both you and your partner.  Previous pregnancies you have had. WHY IS PRENATAL CARE SO IMPORTANT?  By regularly seeing your health care provider, you help ensure that problems can be identified early so that they can be treated as soon as possible. Other problems might be prevented. Many studies have shown that early and regular prenatal care is important for the health of mothers and their babies.  HOW CAN I TAKE CARE OF MYSELF WHILE I AM PREGNANT?  Here are ways to take care of yourself and your baby:   Start or continue taking your  multivitamin with 400 micrograms (mcg) of folic acid every day.  Get early and regular prenatal care. It is very important to see a health care provider during your pregnancy. Your health care provider will check at each visit to make sure that you and your baby are healthy. If there are any problems, action can be taken right away to help you and your baby.  Eat a healthy diet that includes:  Fruits.  Vegetables.  Foods low in saturated fat.  Whole grains.  Calcium-rich foods, such as milk, yogurt, and hard cheeses.  Drink 6-8 glasses of liquids a day.  Unless your health care provider tells you not to, try to be physically active for 30 minutes, most days of the week. If you are pressed for time, you can get your activity in through 10-minute segments, three times a day.  Do not smoke, drink alcohol, or use drugs. These can cause long-term damage to your baby. Talk with your health care provider about steps to take to stop smoking. Talk with a member of your faith community, a counselor, a trusted friend, or your health care provider if you are concerned about your alcohol or drug use.  Ask your health care provider before taking any medicine, even over-the-counter medicines. Some medicines are not safe to take during pregnancy.  Get plenty of rest and sleep.  Avoid hot tubs and saunas during pregnancy.  Do not have X-rays taken unless absolutely necessary and with the recommendation of your health care provider. A lead shield can be placed on your abdomen to protect your baby when  X-rays are taken in other parts of your body. °· Do not empty the cat litter when you are pregnant. It may contain a parasite that causes an infection called toxoplasmosis, which can cause birth defects. Also, use gloves when working in garden areas used by cats. °· Do not eat uncooked or undercooked meats or fish. °· Do not eat soft, mold-ripened cheeses (Brie, Camembert, and chevre) or soft, blue-veined  cheese (Danish blue and Roquefort). °· Stay away from toxic chemicals like: °¨ Insecticides. °¨ Solvents (some cleaners or paint thinners). °¨ Lead. °¨ Mercury. °· Sexual intercourse may continue until the end of the pregnancy, unless you have a medical problem or there is a problem with the pregnancy and your health care provider tells you not to. °· Do not wear high-heel shoes, especially during the second half of the pregnancy. You can lose your balance and fall. °· Do not take long trips, unless absolutely necessary. Be sure to see your health care provider before going on the trip. °· Do not sit in one position for more than 2 hours when on a trip. °· Take a copy of your medical records when going on a trip. Know where a hospital is located in the city you are visiting, in case of an emergency. °· Most dangerous household products will have pregnancy warnings on their labels. Ask your health care provider about products if you are unsure. °· Limit or eliminate your caffeine intake from coffee, tea, sodas, medicines, and chocolate. °· Many women continue working through pregnancy. Staying active might help you stay healthier. If you have a question about the safety or the hours you work at your particular job, talk with your health care provider. °· Get informed: °¨ Read books. °¨ Watch videos. °¨ Go to childbirth classes for you and your significant other. °¨ Talk with experienced moms. °· Ask your health care provider about childbirth education classes for you and your partner. Classes can help you and your partner prepare for the birth of your baby. °· Ask about a baby doctor (pediatrician) and methods and pain medicine for labor, delivery, and possible cesarean delivery. °HOW OFTEN SHOULD I SEE MY HEALTH CARE PROVIDER DURING PREGNANCY?  °Your health care provider will give you a schedule for your prenatal visits. You will have visits more often as you get closer to the end of your pregnancy. An average  pregnancy lasts about 40 weeks.  °A typical schedule includes visiting your health care provider:  °· About once each month during your first 6 months of pregnancy. °· Every 2 weeks during the next 2 months. °· Weekly in the last month, until the delivery date. °Your health care provider will probably want to see you more often if: °· You are older than 35 years. °· Your pregnancy is high risk because you have certain health problems or problems with the pregnancy, such as: °¨ Diabetes. °¨ High blood pressure. °¨ The baby is not growing on schedule, according to the dates of the pregnancy. °Your health care provider will do special tests to make sure you and your baby are not having any serious problems. °WHAT HAPPENS DURING PRENATAL VISITS?  °· At your first prenatal visit, your health care provider will do a physical exam and talk to you about your health history and the health history of your partner and your family. Your health care provider will be able to tell you what date to expect your baby to be born on. °· Your   first physical exam will include checks of your blood pressure, measurements of your height and weight, and an exam of your pelvic organs. Your health care provider will do a Pap test if you have not had one recently and will do cultures of your cervix to make sure there is no infection.  At each prenatal visit, there will be tests of your blood, urine, blood pressure, weight, and the progress of the baby will be checked.  At your later prenatal visits, your health care provider will check how you are doing and how your baby is developing. You may have a number of tests done as your pregnancy progresses.  Ultrasound exams are often used to check on your baby's growth and health.  You may have more urine and blood tests, as well as special tests, if needed. These may include amniocentesis to examine fluid in the pregnancy sac, stress tests to check how the baby responds to contractions, or a  biophysical profile to measure your baby's well-being. Your health care provider will explain the tests and why they are necessary.  You should be tested for high blood sugar (gestational diabetes) between the 24th and 28th weeks of your pregnancy.  You should discuss with your health care provider your plans to breastfeed or bottle-feed your baby.  Each visit is also a chance for you to learn about staying healthy during pregnancy and to ask questions. Document Released: 04/14/2003 Document Revised: 04/16/2013 Document Reviewed: 06/26/2013 [facility] Patient Information 2015 [place]. This information is not intended to replace advice given to you by your health care provider. Make sure you discuss any questions you have with your health care provider.  First Trimester of Pregnancy The first trimester of pregnancy is from week 1 until the end of week 12 (months 1 through 3). During this time, your baby will begin to develop inside you. At 6-8 weeks, the eyes and face are formed, and the heartbeat can be seen on ultrasound. At the end of 12 weeks, all the baby's organs are formed. Prenatal care is all the medical care you receive before the birth of your baby. Make sure you get good prenatal care and follow all of your doctor's instructions. HOME CARE  Medicines  Take medicine only as told by your doctor. Some medicines are safe and some are not during pregnancy.  Take your prenatal vitamins as told by your doctor.  Take medicine that helps you poop (stool softener) as needed if your doctor says it is okay. Diet  Eat regular, healthy meals.  Your doctor will tell you the amount of weight gain that is right for you.  Avoid raw meat and uncooked cheese.  If you feel sick to your stomach (nauseous) or throw up (vomit):  Eat 4 or 5 small meals a day instead of 3 large meals.  Try eating a few soda crackers.  Drink liquids between meals instead of during meals.  If you have a hard  time pooping (constipation):  Eat high-fiber foods like fresh vegetables, fruit, and whole grains.  Drink enough fluids to keep your pee (urine) clear or pale yellow. Activity and Exercise  Exercise only as told by your doctor. Stop exercising if you have cramps or pain in your lower belly (abdomen) or low back.  Try to avoid standing for long periods of time. Move your legs often if you must stand in one place for a long time.  Avoid heavy lifting.  Wear low-heeled shoes. Sit and stand  up straight.  You can have sex unless your doctor tells you not to. Relief of Pain or Discomfort  Wear a good support bra if your breasts are sore.  Take warm water baths (sitz baths) to soothe pain or discomfort caused by hemorrhoids. Use hemorrhoid cream if your doctor says it is okay.  Rest with your legs raised if you have leg cramps or low back pain.  Wear support hose if you have puffy, bulging veins (varicose veins) in your legs. Raise (elevate) your feet for 15 minutes, 3-4 times a day. Limit salt in your diet. Prenatal Care  Schedule your prenatal visits by the twelfth week of pregnancy.  Write down your questions. Take them to your prenatal visits.  Keep all your prenatal visits as told by your doctor. Safety  Wear your seat belt at all times when driving.  Make a list of emergency phone numbers. The list should include numbers for family, friends, the hospital, and police and fire departments. General Tips  Ask your doctor for a referral to a local prenatal class. Begin classes no later than at the start of month 6 of your pregnancy.  Ask for help if you need counseling or help with nutrition. Your doctor can give you advice or tell you where to go for help.  Do not use hot tubs, steam rooms, or saunas.  Do not douche or use tampons or scented sanitary pads.  Do not cross your legs for long periods of time.  Avoid litter boxes and soil used by cats.  Avoid all smoking,  herbs, and alcohol. Avoid drugs not approved by your doctor.  Visit your dentist. At home, brush your teeth with a soft toothbrush. Be gentle when you floss. GET HELP IF:  You are dizzy.  You have mild cramps or pressure in your lower belly.  You have a nagging pain in your belly area.  You continue to feel sick to your stomach, throw up, or have watery poop (diarrhea).  You have a bad smelling fluid coming from your vagina.  You have pain with peeing (urination).  You have increased puffiness (swelling) in your face, hands, legs, or ankles. GET HELP RIGHT AWAY IF:   You have a fever.  You are leaking fluid from your vagina.  You have spotting or bleeding from your vagina.  You have very bad belly cramping or pain.  You gain or lose weight rapidly.  You throw up blood. It may look like coffee grounds.  You are around people who have [place] measles, fifth disease, or chickenpox.  You have a very bad headache.  You have shortness of breath.  You have any kind of trauma, such as from a fall or a car accident. Document Released: 09/28/2007 Document Revised: 08/26/2013 Document Reviewed: 02/19/2013 [facility] Patient Information 2015 [place], [place]. This information is not intended to replace advice given to you by your health care provider. Make sure you discuss any questions you have with your health care provider.  Abdominal Pain During Pregnancy Abdominal pain is common in pregnancy. Most of the time, it does not cause harm. There are many causes of abdominal pain. Some causes are more serious than others. Some of the causes of abdominal pain in pregnancy are easily diagnosed. Occasionally, the diagnosis takes time to understand. Other times, the cause is not determined. Abdominal pain can be a sign that something is very wrong with the pregnancy, or the pain may have nothing to do with the pregnancy at  all. For this reason, always tell your health care provider if you have  any abdominal discomfort. HOME CARE INSTRUCTIONS  Monitor your abdominal pain for any changes. The following actions may help to alleviate any discomfort you are experiencing:  Do not have sexual intercourse or put anything in your vagina until your symptoms go away completely.  Get plenty of rest until your pain improves.  Drink clear fluids if you feel nauseous. Avoid solid food as long as you are uncomfortable or nauseous.  Only take over-the-counter or prescription medicine as directed by your health care provider.  Keep all follow-up appointments with your health care provider. SEEK IMMEDIATE MEDICAL CARE IF:  You are bleeding, leaking fluid, or passing tissue from the vagina.  You have increasing pain or cramping.  You have persistent vomiting.  You have painful or bloody urination.  You have a fever.  You notice a decrease in your baby's movements.  You have extreme weakness or feel faint.  You have shortness of breath, with or without abdominal pain.  You develop a severe headache with abdominal pain.  You have abnormal vaginal discharge with abdominal pain.  You have persistent diarrhea.  You have abdominal pain that continues even after rest, or gets worse. MAKE SURE YOU:   Understand these instructions.  Will watch your condition.  Will get help right away if you are not doing well or get worse. Document Released: 04/11/2005 Document Revised: 01/30/2013 Document Reviewed: 11/08/2012 [facility] Patient Information 2015 [place], [place]. This information is not intended to replace advice given to you by your health care provider. Make sure you discuss any questions you have with your health care provider.

## 2013-11-27 LAB — OB RESULTS CONSOLE ABO/RH: RH Type: POSITIVE

## 2013-11-27 LAB — OB RESULTS CONSOLE HEPATITIS B SURFACE ANTIGEN: HEP B S AG: NEGATIVE

## 2013-11-27 LAB — OB RESULTS CONSOLE GC/CHLAMYDIA
Chlamydia: NEGATIVE
Gonorrhea: NEGATIVE

## 2013-11-27 LAB — OB RESULTS CONSOLE HIV ANTIBODY (ROUTINE TESTING): HIV: NONREACTIVE

## 2013-11-27 LAB — OB RESULTS CONSOLE RPR: RPR: NONREACTIVE

## 2013-11-27 LAB — OB RESULTS CONSOLE ANTIBODY SCREEN: Antibody Screen: NEGATIVE

## 2013-11-27 LAB — OB RESULTS CONSOLE RUBELLA ANTIBODY, IGM: Rubella: IMMUNE

## 2013-12-09 ENCOUNTER — Inpatient Hospital Stay (HOSPITAL_COMMUNITY): Admission: AD | Admit: 2013-12-09 | Payer: Medicaid Other | Source: Ambulatory Visit | Admitting: Obstetrics

## 2014-01-07 ENCOUNTER — Encounter (HOSPITAL_BASED_OUTPATIENT_CLINIC_OR_DEPARTMENT_OTHER): Payer: Self-pay | Admitting: Emergency Medicine

## 2014-02-04 ENCOUNTER — Other Ambulatory Visit (HOSPITAL_COMMUNITY): Payer: Self-pay | Admitting: Obstetrics

## 2014-02-04 DIAGNOSIS — N883 Incompetence of cervix uteri: Secondary | ICD-10-CM

## 2014-02-04 DIAGNOSIS — O09521 Supervision of elderly multigravida, first trimester: Secondary | ICD-10-CM

## 2014-02-05 ENCOUNTER — Ambulatory Visit (HOSPITAL_COMMUNITY)
Admission: RE | Admit: 2014-02-05 | Discharge: 2014-02-05 | Disposition: A | Discharge status: Home / Self Care | Payer: Commercial Managed Care - PPO | Source: Ambulatory Visit | Attending: Obstetrics | Admitting: Obstetrics

## 2014-02-05 ENCOUNTER — Other Ambulatory Visit (HOSPITAL_COMMUNITY): Payer: Self-pay | Admitting: Obstetrics

## 2014-02-05 ENCOUNTER — Encounter (HOSPITAL_COMMUNITY): Payer: Self-pay

## 2014-02-05 DIAGNOSIS — O09521 Supervision of elderly multigravida, first trimester: Secondary | ICD-10-CM

## 2014-02-05 DIAGNOSIS — O3430 Maternal care for cervical incompetence, unspecified trimester: Secondary | ICD-10-CM | POA: Diagnosis not present

## 2014-02-05 DIAGNOSIS — N883 Incompetence of cervix uteri: Secondary | ICD-10-CM

## 2014-02-05 DIAGNOSIS — O09522 Supervision of elderly multigravida, second trimester: Secondary | ICD-10-CM

## 2014-02-05 DIAGNOSIS — O09529 Supervision of elderly multigravida, unspecified trimester: Secondary | ICD-10-CM | POA: Insufficient documentation

## 2014-02-05 DIAGNOSIS — Z3A00 Weeks of gestation of pregnancy not specified: Secondary | ICD-10-CM | POA: Diagnosis not present

## 2014-02-05 DIAGNOSIS — Z3689 Encounter for other specified antenatal screening: Secondary | ICD-10-CM

## 2014-02-05 DIAGNOSIS — Z3A18 18 weeks gestation of pregnancy: Secondary | ICD-10-CM

## 2014-02-11 ENCOUNTER — Emergency Department (HOSPITAL_COMMUNITY)
Admission: EM | Admit: 2014-02-11 | Discharge: 2014-02-11 | Disposition: A | Discharge status: Home / Self Care | Payer: Commercial Managed Care - PPO | Attending: Emergency Medicine | Admitting: Emergency Medicine

## 2014-02-11 ENCOUNTER — Encounter (HOSPITAL_COMMUNITY): Payer: Self-pay | Admitting: Emergency Medicine

## 2014-02-11 DIAGNOSIS — Z3A16 16 weeks gestation of pregnancy: Secondary | ICD-10-CM | POA: Insufficient documentation

## 2014-02-11 DIAGNOSIS — I10 Essential (primary) hypertension: Secondary | ICD-10-CM | POA: Diagnosis not present

## 2014-02-11 DIAGNOSIS — R002 Palpitations: Secondary | ICD-10-CM | POA: Insufficient documentation

## 2014-02-11 DIAGNOSIS — R079 Chest pain, unspecified: Secondary | ICD-10-CM | POA: Insufficient documentation

## 2014-02-11 DIAGNOSIS — Z87891 Personal history of nicotine dependence: Secondary | ICD-10-CM | POA: Insufficient documentation

## 2014-02-11 DIAGNOSIS — R0602 Shortness of breath: Secondary | ICD-10-CM | POA: Insufficient documentation

## 2014-02-11 DIAGNOSIS — O9989 Other specified diseases and conditions complicating pregnancy, childbirth and the puerperium: Secondary | ICD-10-CM | POA: Diagnosis present

## 2014-02-11 DIAGNOSIS — Z79899 Other long term (current) drug therapy: Secondary | ICD-10-CM | POA: Diagnosis not present

## 2014-02-11 LAB — URINALYSIS, ROUTINE W REFLEX MICROSCOPIC
Bilirubin Urine: NEGATIVE
Glucose, UA: NEGATIVE mg/dL
Hgb urine dipstick: NEGATIVE
Ketones, ur: NEGATIVE mg/dL
LEUKOCYTES UA: NEGATIVE
NITRITE: NEGATIVE
PH: 6 (ref 5.0–8.0)
Protein, ur: NEGATIVE mg/dL
SPECIFIC GRAVITY, URINE: 1.024 (ref 1.005–1.030)
Urobilinogen, UA: 0.2 mg/dL (ref 0.0–1.0)

## 2014-02-11 LAB — I-STAT TROPONIN, ED
TROPONIN I, POC: 0 ng/mL (ref 0.00–0.08)
Troponin i, poc: 0 ng/mL (ref 0.00–0.08)

## 2014-02-11 LAB — CBC
HEMATOCRIT: 33.8 % — AB (ref 36.0–46.0)
HEMOGLOBIN: 11.8 g/dL — AB (ref 12.0–15.0)
MCH: 29 pg (ref 26.0–34.0)
MCHC: 34.9 g/dL (ref 30.0–36.0)
MCV: 83 fL (ref 78.0–100.0)
Platelets: 346 10*3/uL (ref 150–400)
RBC: 4.07 MIL/uL (ref 3.87–5.11)
RDW: 16.1 % — ABNORMAL HIGH (ref 11.5–15.5)
WBC: 7.2 10*3/uL (ref 4.0–10.5)

## 2014-02-11 LAB — BASIC METABOLIC PANEL
Anion gap: 12 (ref 5–15)
BUN: 7 mg/dL (ref 6–23)
CHLORIDE: 96 meq/L (ref 96–112)
CO2: 24 mEq/L (ref 19–32)
Calcium: 9.8 mg/dL (ref 8.4–10.5)
Creatinine, Ser: 0.63 mg/dL (ref 0.50–1.10)
GFR calc Af Amer: >90 mL/min (ref >90)
GFR calc non Af Amer: >90 mL/min (ref >90)
GLUCOSE: 101 mg/dL — AB (ref 70–99)
POTASSIUM: 3.8 meq/L (ref 3.7–5.3)
Sodium: 132 mEq/L — ABNORMAL LOW (ref 137–147)

## 2014-02-11 LAB — D-DIMER, QUANTITATIVE (NOT AT ARMC): D DIMER QUANT: 0.48 ug{FEU}/mL (ref 0.00–0.48)

## 2014-02-11 MED ORDER — SODIUM CHLORIDE 0.9 % IV BOLUS (SEPSIS)
1000.0000 mL | Freq: Once | INTRAVENOUS | Status: AC
  Administered 2014-02-11: 1000 mL via INTRAVENOUS

## 2014-02-11 NOTE — Discharge Instructions (Signed)
Stay hydrated.  Take tylenol as needed for pain.  See your OB doctor.   Return to ER if you have worse chest pain, shortness of breath, palpitations.

## 2014-02-11 NOTE — ED Notes (Signed)
Pt c/o intermittent heart palpitations and L shoulder blade pain starting this morning.  Pain score 5/10.  Pt reports that pain started while she was sitting at work.  Denies new medications, energy drinks, or weight loss pills.  Sts she is [redacted] weeks pregnant.

## 2014-02-11 NOTE — ED Provider Notes (Signed)
CSN: [id number]     Arrival date & time 02/11/14  1346 History   First MD Initiated Contact with Patient 02/11/14 1537     Chief Complaint  Patient presents with  . Palpitations     (Consider location/radiation/quality/duration/timing/severity/associated sxs/prior Treatment) The history is provided by the patient.  Anissa Cleda MccreedyM Louro is a 43 y.o. female hx of HTN, [redacted] weeks pregnant here with chest pain, palpitations, shortness of breath. She was sitting at her office tests and had  intermittent palpitations and shortness of breath since 10 AM today. Symptoms comes and goes. She said that it took her breath away. Chest pain on the left side with no radiation. Denies history of CAD. Denies recent travel history of PE. Denies any vaginal bleeding or discharge.   Past Medical History  Diagnosis Date  . Hypertension    Past Surgical History  Procedure Laterality Date  . Breast surgery     Family History  Problem Relation Age of Onset  . Migraines Neg Hx   . High blood pressure Mother   . High blood pressure Maternal Aunt   . High blood pressure Sister    History  Substance Use Topics  . Smoking status: Former Games developermoker  . Smokeless tobacco: Not on file  . Alcohol Use: Yes     Comment: before pregnant   OB History   Grav Para Term Preterm Abortions TAB SAB Ect Mult Living   1              Review of Systems  Respiratory: Positive for shortness of breath.   Cardiovascular: Positive for chest pain and palpitations.  All other systems reviewed and are negative.     Allergies  Review of patient's allergies indicates no known allergies.  Home Medications   Prior to Admission medications   Medication Sig Start Date End Date Taking? Authorizing Provider  labetalol (NORMODYNE) 200 MG tablet Take 200 mg by mouth 3 (three) times daily.   Yes Historical Provider, MD  Prenatal Vit-Fe Fumarate-FA (MULTIVITAMIN-PRENATAL) 27-0.8 MG TABS tablet Take 1 tablet by mouth daily at 12  noon.   Yes Historical Provider, MD  ranitidine (ZANTAC) 300 MG tablet Take 300 mg by mouth at bedtime.   Yes Historical Provider, MD   BP 139/62  Pulse 93  Temp(Src) 98.6 F (37 C) (Oral)  Resp 18  SpO2 99%  LMP 10/27/2013 Physical Exam  Nursing note and vitals reviewed. Constitutional: She is oriented to person, place, and time. She appears well-developed and well-nourished.  HENT:  Head: Normocephalic.  Mouth/Throat: Oropharynx is clear and moist.  Eyes: Conjunctivae and EOM are normal. Pupils are equal, round, and reactive to light.  Neck: Normal range of motion. Neck supple.  Cardiovascular: Normal rate, regular rhythm and normal heart sounds.   Pulmonary/Chest: Effort normal and breath sounds normal. No respiratory distress. She has no wheezes. She has no rales.  Abdominal: Soft. Bowel sounds are normal.  Gravid uterus, overweight   Musculoskeletal: Normal range of motion. She exhibits no edema and no tenderness.  Neurological: She is alert and oriented to person, place, and time. No cranial nerve deficit. Coordination normal.  Skin: Skin is warm and dry.  Psychiatric: She has a normal mood and affect. Her behavior is normal. Judgment and thought content normal.    ED Course  Procedures (including critical care time) Labs Review Labs Reviewed  CBC - Abnormal; Notable for the following:    Hemoglobin 11.8 (*)    HCT 33.8 (*)  RDW 16.1 (*)    All other components within normal limits  BASIC METABOLIC PANEL - Abnormal; Notable for the following:    Sodium 132 (*)    Glucose, Bld 101 (*)    All other components within normal limits  URINALYSIS, ROUTINE W REFLEX MICROSCOPIC  D-DIMER, QUANTITATIVE  I-STAT TROPOININ, ED  I-STAT TROPOININ, ED    Imaging Review No results found.   EKG Interpretation None       Date: 02/11/2014  Rate: 85  Rhythm: normal sinus rhythm  QRS Axis: normal  Intervals: normal  ST/T Wave abnormalities: nonspecific ST changes   Conduction Disutrbances:none  Narrative Interpretation:   Old EKG Reviewed: none available    MDM   Final diagnoses:  None    Rhonda Cleda MccreedyM [place] is a 43 y.o. female here with shortness of breath, palpitations. Consider PE given that she is [redacted] weeks pregnant. Will get d-dimer. Lungs clear. I doubt dissection and no signs of pneumonia. Will hold off on cxr given that she is pregnant.   6:04 PM D-dimer normal. Delta trop neg. I doubt PE or ACS or dissection. Stable for outpatient f/u.    Richardean Canalavid H Ercel [place], MD 02/11/14 [phone]

## 2014-02-13 ENCOUNTER — Other Ambulatory Visit (HOSPITAL_COMMUNITY): Payer: Self-pay | Admitting: Maternal and Fetal Medicine

## 2014-02-13 DIAGNOSIS — Z3A14 14 weeks gestation of pregnancy: Secondary | ICD-10-CM

## 2014-02-13 DIAGNOSIS — O2622 Pregnancy care for patient with recurrent pregnancy loss, second trimester: Secondary | ICD-10-CM

## 2014-02-13 DIAGNOSIS — O10912 Unspecified pre-existing hypertension complicating pregnancy, second trimester: Secondary | ICD-10-CM

## 2014-02-13 DIAGNOSIS — O09522 Supervision of elderly multigravida, second trimester: Secondary | ICD-10-CM

## 2014-02-13 DIAGNOSIS — O09292 Supervision of pregnancy with other poor reproductive or obstetric history, second trimester: Secondary | ICD-10-CM

## 2014-02-18 ENCOUNTER — Encounter (HOSPITAL_COMMUNITY): Payer: Self-pay

## 2014-02-18 ENCOUNTER — Ambulatory Visit (HOSPITAL_COMMUNITY)
Admission: RE | Admit: 2014-02-18 | Discharge: 2014-02-18 | Disposition: A | Discharge status: Home / Self Care | Payer: Commercial Managed Care - PPO | Source: Ambulatory Visit | Attending: Obstetrics | Admitting: Obstetrics

## 2014-02-18 VITALS — BP 145/90 | HR 93 | Wt 294.0 lb

## 2014-02-18 DIAGNOSIS — O09522 Supervision of elderly multigravida, second trimester: Secondary | ICD-10-CM

## 2014-02-18 DIAGNOSIS — O2622 Pregnancy care for patient with recurrent pregnancy loss, second trimester: Secondary | ICD-10-CM | POA: Diagnosis not present

## 2014-02-18 DIAGNOSIS — O09292 Supervision of pregnancy with other poor reproductive or obstetric history, second trimester: Secondary | ICD-10-CM

## 2014-02-18 DIAGNOSIS — O10012 Pre-existing essential hypertension complicating pregnancy, second trimester: Secondary | ICD-10-CM | POA: Diagnosis not present

## 2014-02-18 DIAGNOSIS — Z3A16 16 weeks gestation of pregnancy: Secondary | ICD-10-CM | POA: Diagnosis not present

## 2014-02-18 DIAGNOSIS — O10912 Unspecified pre-existing hypertension complicating pregnancy, second trimester: Secondary | ICD-10-CM

## 2014-02-18 DIAGNOSIS — Z3A14 14 weeks gestation of pregnancy: Secondary | ICD-10-CM

## 2014-02-20 ENCOUNTER — Other Ambulatory Visit (HOSPITAL_COMMUNITY): Payer: Self-pay | Admitting: Obstetrics

## 2014-02-20 DIAGNOSIS — O99212 Obesity complicating pregnancy, second trimester: Secondary | ICD-10-CM

## 2014-02-20 DIAGNOSIS — O10912 Unspecified pre-existing hypertension complicating pregnancy, second trimester: Secondary | ICD-10-CM

## 2014-02-20 DIAGNOSIS — O2620 Pregnancy care for patient with recurrent pregnancy loss, unspecified trimester: Secondary | ICD-10-CM

## 2014-02-20 DIAGNOSIS — O09522 Supervision of elderly multigravida, second trimester: Secondary | ICD-10-CM

## 2014-02-20 DIAGNOSIS — O42919 Preterm premature rupture of membranes, unspecified as to length of time between rupture and onset of labor, unspecified trimester: Secondary | ICD-10-CM

## 2014-02-24 ENCOUNTER — Encounter (HOSPITAL_COMMUNITY): Payer: Self-pay

## 2014-03-04 ENCOUNTER — Other Ambulatory Visit (HOSPITAL_COMMUNITY): Payer: Self-pay | Admitting: Obstetrics

## 2014-03-04 ENCOUNTER — Ambulatory Visit (HOSPITAL_COMMUNITY)
Admission: RE | Admit: 2014-03-04 | Discharge: 2014-03-04 | Disposition: A | Discharge status: Home / Self Care | Payer: Commercial Managed Care - PPO | Source: Ambulatory Visit | Attending: Obstetrics | Admitting: Obstetrics

## 2014-03-04 ENCOUNTER — Encounter (HOSPITAL_COMMUNITY): Payer: Self-pay

## 2014-03-04 ENCOUNTER — Other Ambulatory Visit (HOSPITAL_COMMUNITY): Payer: Self-pay

## 2014-03-04 ENCOUNTER — Ambulatory Visit (HOSPITAL_COMMUNITY): Payer: Commercial Managed Care - PPO

## 2014-03-04 DIAGNOSIS — Z3689 Encounter for other specified antenatal screening: Secondary | ICD-10-CM

## 2014-03-04 DIAGNOSIS — O42919 Preterm premature rupture of membranes, unspecified as to length of time between rupture and onset of labor, unspecified trimester: Secondary | ICD-10-CM

## 2014-03-04 DIAGNOSIS — O09522 Supervision of elderly multigravida, second trimester: Secondary | ICD-10-CM | POA: Insufficient documentation

## 2014-03-04 DIAGNOSIS — O99212 Obesity complicating pregnancy, second trimester: Secondary | ICD-10-CM

## 2014-03-04 DIAGNOSIS — O99210 Obesity complicating pregnancy, unspecified trimester: Secondary | ICD-10-CM | POA: Insufficient documentation

## 2014-03-04 DIAGNOSIS — O10912 Unspecified pre-existing hypertension complicating pregnancy, second trimester: Secondary | ICD-10-CM

## 2014-03-04 DIAGNOSIS — Z3A18 18 weeks gestation of pregnancy: Secondary | ICD-10-CM | POA: Insufficient documentation

## 2014-03-04 DIAGNOSIS — O10919 Unspecified pre-existing hypertension complicating pregnancy, unspecified trimester: Secondary | ICD-10-CM | POA: Insufficient documentation

## 2014-03-04 DIAGNOSIS — O09292 Supervision of pregnancy with other poor reproductive or obstetric history, second trimester: Secondary | ICD-10-CM | POA: Insufficient documentation

## 2014-03-04 DIAGNOSIS — O2620 Pregnancy care for patient with recurrent pregnancy loss, unspecified trimester: Secondary | ICD-10-CM

## 2014-03-04 DIAGNOSIS — O2622 Pregnancy care for patient with recurrent pregnancy loss, second trimester: Secondary | ICD-10-CM | POA: Diagnosis not present

## 2014-03-04 DIAGNOSIS — Z131 Encounter for screening for diabetes mellitus: Secondary | ICD-10-CM | POA: Insufficient documentation

## 2014-03-04 DIAGNOSIS — O10012 Pre-existing essential hypertension complicating pregnancy, second trimester: Secondary | ICD-10-CM | POA: Diagnosis not present

## 2014-03-05 ENCOUNTER — Encounter (HOSPITAL_COMMUNITY): Payer: Self-pay

## 2014-03-18 ENCOUNTER — Other Ambulatory Visit (HOSPITAL_COMMUNITY): Payer: Self-pay | Admitting: Obstetrics

## 2014-03-18 ENCOUNTER — Encounter (HOSPITAL_COMMUNITY): Payer: Self-pay

## 2014-03-18 ENCOUNTER — Ambulatory Visit (HOSPITAL_COMMUNITY): Payer: Commercial Managed Care - PPO

## 2014-03-18 ENCOUNTER — Ambulatory Visit (HOSPITAL_COMMUNITY)
Admission: RE | Admit: 2014-03-18 | Discharge: 2014-03-18 | Disposition: A | Discharge status: Home / Self Care | Payer: Commercial Managed Care - PPO | Source: Ambulatory Visit | Attending: Obstetrics | Admitting: Obstetrics

## 2014-03-18 DIAGNOSIS — O09522 Supervision of elderly multigravida, second trimester: Secondary | ICD-10-CM

## 2014-03-18 DIAGNOSIS — O42919 Preterm premature rupture of membranes, unspecified as to length of time between rupture and onset of labor, unspecified trimester: Secondary | ICD-10-CM

## 2014-03-18 DIAGNOSIS — O09529 Supervision of elderly multigravida, unspecified trimester: Secondary | ICD-10-CM | POA: Insufficient documentation

## 2014-03-18 DIAGNOSIS — O10912 Unspecified pre-existing hypertension complicating pregnancy, second trimester: Secondary | ICD-10-CM

## 2014-03-18 DIAGNOSIS — Z3A20 20 weeks gestation of pregnancy: Secondary | ICD-10-CM | POA: Diagnosis not present

## 2014-03-18 DIAGNOSIS — O99212 Obesity complicating pregnancy, second trimester: Secondary | ICD-10-CM

## 2014-03-18 DIAGNOSIS — O2620 Pregnancy care for patient with recurrent pregnancy loss, unspecified trimester: Secondary | ICD-10-CM

## 2014-03-18 DIAGNOSIS — Z0489 Encounter for examination and observation for other specified reasons: Secondary | ICD-10-CM | POA: Insufficient documentation

## 2014-03-18 DIAGNOSIS — O10012 Pre-existing essential hypertension complicating pregnancy, second trimester: Secondary | ICD-10-CM | POA: Insufficient documentation

## 2014-03-18 DIAGNOSIS — O09292 Supervision of pregnancy with other poor reproductive or obstetric history, second trimester: Secondary | ICD-10-CM | POA: Insufficient documentation

## 2014-03-18 DIAGNOSIS — O09299 Supervision of pregnancy with other poor reproductive or obstetric history, unspecified trimester: Secondary | ICD-10-CM | POA: Insufficient documentation

## 2014-03-18 DIAGNOSIS — IMO0002 Reserved for concepts with insufficient information to code with codable children: Secondary | ICD-10-CM | POA: Insufficient documentation

## 2014-03-19 ENCOUNTER — Other Ambulatory Visit (HOSPITAL_COMMUNITY): Payer: Self-pay

## 2014-04-01 ENCOUNTER — Encounter (HOSPITAL_COMMUNITY): Payer: Self-pay

## 2014-04-01 ENCOUNTER — Ambulatory Visit (HOSPITAL_COMMUNITY)
Admission: RE | Admit: 2014-04-01 | Discharge: 2014-04-01 | Disposition: A | Discharge status: Home / Self Care | Payer: Commercial Managed Care - PPO | Source: Ambulatory Visit | Attending: Obstetrics | Admitting: Obstetrics

## 2014-04-01 DIAGNOSIS — O10919 Unspecified pre-existing hypertension complicating pregnancy, unspecified trimester: Secondary | ICD-10-CM | POA: Insufficient documentation

## 2014-04-01 DIAGNOSIS — O09522 Supervision of elderly multigravida, second trimester: Secondary | ICD-10-CM | POA: Insufficient documentation

## 2014-04-01 DIAGNOSIS — O10912 Unspecified pre-existing hypertension complicating pregnancy, second trimester: Secondary | ICD-10-CM

## 2014-04-01 DIAGNOSIS — O42919 Preterm premature rupture of membranes, unspecified as to length of time between rupture and onset of labor, unspecified trimester: Secondary | ICD-10-CM | POA: Insufficient documentation

## 2014-04-01 DIAGNOSIS — O42912 Preterm premature rupture of membranes, unspecified as to length of time between rupture and onset of labor, second trimester: Secondary | ICD-10-CM | POA: Insufficient documentation

## 2014-04-01 DIAGNOSIS — Z3A22 22 weeks gestation of pregnancy: Secondary | ICD-10-CM | POA: Diagnosis not present

## 2014-04-01 DIAGNOSIS — O99212 Obesity complicating pregnancy, second trimester: Secondary | ICD-10-CM | POA: Diagnosis not present

## 2014-04-01 DIAGNOSIS — O10012 Pre-existing essential hypertension complicating pregnancy, second trimester: Secondary | ICD-10-CM | POA: Insufficient documentation

## 2014-04-01 DIAGNOSIS — O2620 Pregnancy care for patient with recurrent pregnancy loss, unspecified trimester: Secondary | ICD-10-CM | POA: Insufficient documentation

## 2014-04-01 DIAGNOSIS — O2622 Pregnancy care for patient with recurrent pregnancy loss, second trimester: Secondary | ICD-10-CM | POA: Diagnosis not present

## 2014-04-15 ENCOUNTER — Encounter (HOSPITAL_COMMUNITY): Payer: Self-pay

## 2014-04-15 ENCOUNTER — Ambulatory Visit (HOSPITAL_COMMUNITY)
Admission: RE | Admit: 2014-04-15 | Discharge: 2014-04-15 | Disposition: A | Discharge status: Home / Self Care | Payer: Commercial Managed Care - PPO | Source: Ambulatory Visit | Attending: Obstetrics | Admitting: Obstetrics

## 2014-04-15 DIAGNOSIS — O2620 Pregnancy care for patient with recurrent pregnancy loss, unspecified trimester: Secondary | ICD-10-CM

## 2014-04-15 DIAGNOSIS — O99212 Obesity complicating pregnancy, second trimester: Secondary | ICD-10-CM | POA: Insufficient documentation

## 2014-04-15 DIAGNOSIS — E669 Obesity, unspecified: Secondary | ICD-10-CM | POA: Diagnosis not present

## 2014-04-15 DIAGNOSIS — O42919 Preterm premature rupture of membranes, unspecified as to length of time between rupture and onset of labor, unspecified trimester: Secondary | ICD-10-CM | POA: Insufficient documentation

## 2014-04-15 DIAGNOSIS — O10912 Unspecified pre-existing hypertension complicating pregnancy, second trimester: Secondary | ICD-10-CM | POA: Insufficient documentation

## 2014-04-15 DIAGNOSIS — O09522 Supervision of elderly multigravida, second trimester: Secondary | ICD-10-CM | POA: Insufficient documentation

## 2014-04-15 DIAGNOSIS — O09299 Supervision of pregnancy with other poor reproductive or obstetric history, unspecified trimester: Secondary | ICD-10-CM | POA: Insufficient documentation

## 2014-04-15 DIAGNOSIS — Z3A24 24 weeks gestation of pregnancy: Secondary | ICD-10-CM | POA: Insufficient documentation

## 2014-04-15 NOTE — ED Notes (Signed)
Pt stats she has been having more swelling in her feet.  Denies blurred vision, but has had head aches that are relieved by tylenol.

## 2014-04-16 ENCOUNTER — Other Ambulatory Visit (HOSPITAL_COMMUNITY): Payer: Self-pay | Admitting: Obstetrics

## 2014-04-16 DIAGNOSIS — O99212 Obesity complicating pregnancy, second trimester: Secondary | ICD-10-CM

## 2014-04-16 DIAGNOSIS — O10912 Unspecified pre-existing hypertension complicating pregnancy, second trimester: Secondary | ICD-10-CM

## 2014-04-16 DIAGNOSIS — O09529 Supervision of elderly multigravida, unspecified trimester: Secondary | ICD-10-CM

## 2014-04-25 HISTORY — PX: TUBAL LIGATION: SHX77

## 2014-05-15 ENCOUNTER — Other Ambulatory Visit (HOSPITAL_COMMUNITY): Payer: Self-pay | Admitting: Maternal and Fetal Medicine

## 2014-05-15 ENCOUNTER — Encounter (HOSPITAL_COMMUNITY): Payer: Self-pay

## 2014-05-15 ENCOUNTER — Ambulatory Visit (HOSPITAL_COMMUNITY)
Admission: RE | Admit: 2014-05-15 | Discharge: 2014-05-15 | Disposition: A | Discharge status: Home / Self Care | Payer: Commercial Managed Care - PPO | Source: Ambulatory Visit | Attending: Obstetrics | Admitting: Obstetrics

## 2014-05-15 DIAGNOSIS — O09529 Supervision of elderly multigravida, unspecified trimester: Secondary | ICD-10-CM | POA: Insufficient documentation

## 2014-05-15 DIAGNOSIS — O99212 Obesity complicating pregnancy, second trimester: Secondary | ICD-10-CM

## 2014-05-15 DIAGNOSIS — E669 Obesity, unspecified: Secondary | ICD-10-CM | POA: Diagnosis not present

## 2014-05-15 DIAGNOSIS — O10912 Unspecified pre-existing hypertension complicating pregnancy, second trimester: Secondary | ICD-10-CM

## 2014-05-15 DIAGNOSIS — Z3A28 28 weeks gestation of pregnancy: Secondary | ICD-10-CM | POA: Insufficient documentation

## 2014-05-15 DIAGNOSIS — O10913 Unspecified pre-existing hypertension complicating pregnancy, third trimester: Secondary | ICD-10-CM | POA: Insufficient documentation

## 2014-05-15 DIAGNOSIS — O10919 Unspecified pre-existing hypertension complicating pregnancy, unspecified trimester: Secondary | ICD-10-CM | POA: Insufficient documentation

## 2014-05-15 DIAGNOSIS — Z3A26 26 weeks gestation of pregnancy: Secondary | ICD-10-CM | POA: Insufficient documentation

## 2014-06-12 ENCOUNTER — Other Ambulatory Visit (HOSPITAL_COMMUNITY): Payer: Self-pay | Admitting: Maternal and Fetal Medicine

## 2014-06-12 ENCOUNTER — Ambulatory Visit (HOSPITAL_COMMUNITY)
Admission: RE | Admit: 2014-06-12 | Discharge: 2014-06-12 | Disposition: A | Discharge status: Home / Self Care | Payer: Commercial Managed Care - PPO | Source: Ambulatory Visit | Attending: Obstetrics | Admitting: Obstetrics

## 2014-06-12 ENCOUNTER — Encounter (HOSPITAL_COMMUNITY): Payer: Self-pay

## 2014-06-12 DIAGNOSIS — O99213 Obesity complicating pregnancy, third trimester: Secondary | ICD-10-CM

## 2014-06-12 DIAGNOSIS — O10013 Pre-existing essential hypertension complicating pregnancy, third trimester: Secondary | ICD-10-CM

## 2014-06-12 DIAGNOSIS — O09523 Supervision of elderly multigravida, third trimester: Secondary | ICD-10-CM | POA: Insufficient documentation

## 2014-06-12 DIAGNOSIS — O09529 Supervision of elderly multigravida, unspecified trimester: Secondary | ICD-10-CM

## 2014-06-12 DIAGNOSIS — O09293 Supervision of pregnancy with other poor reproductive or obstetric history, third trimester: Secondary | ICD-10-CM

## 2014-06-12 DIAGNOSIS — O10912 Unspecified pre-existing hypertension complicating pregnancy, second trimester: Secondary | ICD-10-CM

## 2014-06-12 DIAGNOSIS — Z3A32 32 weeks gestation of pregnancy: Secondary | ICD-10-CM | POA: Insufficient documentation

## 2014-06-12 DIAGNOSIS — O99212 Obesity complicating pregnancy, second trimester: Secondary | ICD-10-CM

## 2014-06-16 ENCOUNTER — Ambulatory Visit (HOSPITAL_COMMUNITY): Payer: Commercial Managed Care - PPO

## 2014-06-17 ENCOUNTER — Ambulatory Visit (HOSPITAL_COMMUNITY)
Admission: RE | Admit: 2014-06-17 | Discharge: 2014-06-17 | Disposition: A | Discharge status: Home / Self Care | Payer: Commercial Managed Care - PPO | Source: Ambulatory Visit | Attending: Obstetrics | Admitting: Obstetrics

## 2014-06-17 DIAGNOSIS — O09523 Supervision of elderly multigravida, third trimester: Secondary | ICD-10-CM | POA: Insufficient documentation

## 2014-06-17 DIAGNOSIS — O99213 Obesity complicating pregnancy, third trimester: Secondary | ICD-10-CM | POA: Insufficient documentation

## 2014-06-17 DIAGNOSIS — O10013 Pre-existing essential hypertension complicating pregnancy, third trimester: Secondary | ICD-10-CM | POA: Diagnosis not present

## 2014-06-17 DIAGNOSIS — O09293 Supervision of pregnancy with other poor reproductive or obstetric history, third trimester: Secondary | ICD-10-CM | POA: Diagnosis not present

## 2014-06-17 DIAGNOSIS — Z3A32 32 weeks gestation of pregnancy: Secondary | ICD-10-CM | POA: Insufficient documentation

## 2014-06-17 DIAGNOSIS — Z3A33 33 weeks gestation of pregnancy: Secondary | ICD-10-CM | POA: Insufficient documentation

## 2014-06-17 DIAGNOSIS — O09519 Supervision of elderly primigravida, unspecified trimester: Secondary | ICD-10-CM | POA: Insufficient documentation

## 2014-06-17 DIAGNOSIS — O10913 Unspecified pre-existing hypertension complicating pregnancy, third trimester: Secondary | ICD-10-CM | POA: Insufficient documentation

## 2014-06-19 ENCOUNTER — Other Ambulatory Visit (HOSPITAL_COMMUNITY): Payer: Self-pay

## 2014-06-19 ENCOUNTER — Ambulatory Visit (HOSPITAL_COMMUNITY): Payer: Commercial Managed Care - PPO

## 2014-06-20 ENCOUNTER — Ambulatory Visit (HOSPITAL_COMMUNITY)
Admission: RE | Admit: 2014-06-20 | Discharge: 2014-06-20 | Disposition: A | Discharge status: Home / Self Care | Payer: Commercial Managed Care - PPO | Source: Ambulatory Visit | Attending: Obstetrics | Admitting: Obstetrics

## 2014-06-20 ENCOUNTER — Encounter (HOSPITAL_COMMUNITY): Payer: Self-pay

## 2014-06-20 DIAGNOSIS — O09523 Supervision of elderly multigravida, third trimester: Secondary | ICD-10-CM | POA: Diagnosis not present

## 2014-06-20 DIAGNOSIS — O10013 Pre-existing essential hypertension complicating pregnancy, third trimester: Secondary | ICD-10-CM | POA: Insufficient documentation

## 2014-06-20 DIAGNOSIS — Z3A33 33 weeks gestation of pregnancy: Secondary | ICD-10-CM | POA: Diagnosis not present

## 2014-06-20 DIAGNOSIS — O09293 Supervision of pregnancy with other poor reproductive or obstetric history, third trimester: Secondary | ICD-10-CM | POA: Insufficient documentation

## 2014-06-20 DIAGNOSIS — O99213 Obesity complicating pregnancy, third trimester: Secondary | ICD-10-CM

## 2014-06-24 ENCOUNTER — Ambulatory Visit (HOSPITAL_COMMUNITY): Payer: Commercial Managed Care - PPO

## 2014-06-27 ENCOUNTER — Ambulatory Visit (HOSPITAL_COMMUNITY)
Admission: RE | Admit: 2014-06-27 | Discharge: 2014-06-27 | Disposition: A | Discharge status: Home / Self Care | Payer: Commercial Managed Care - PPO | Source: Ambulatory Visit | Attending: Obstetrics | Admitting: Obstetrics

## 2014-06-27 ENCOUNTER — Encounter (HOSPITAL_COMMUNITY): Payer: Self-pay

## 2014-06-27 DIAGNOSIS — O99213 Obesity complicating pregnancy, third trimester: Secondary | ICD-10-CM

## 2014-06-27 DIAGNOSIS — O10013 Pre-existing essential hypertension complicating pregnancy, third trimester: Secondary | ICD-10-CM | POA: Diagnosis not present

## 2014-06-27 DIAGNOSIS — O09523 Supervision of elderly multigravida, third trimester: Secondary | ICD-10-CM

## 2014-06-27 DIAGNOSIS — O09299 Supervision of pregnancy with other poor reproductive or obstetric history, unspecified trimester: Secondary | ICD-10-CM | POA: Insufficient documentation

## 2014-06-27 DIAGNOSIS — Z3A34 34 weeks gestation of pregnancy: Secondary | ICD-10-CM | POA: Insufficient documentation

## 2014-06-27 DIAGNOSIS — O09293 Supervision of pregnancy with other poor reproductive or obstetric history, third trimester: Secondary | ICD-10-CM

## 2014-06-30 ENCOUNTER — Ambulatory Visit (HOSPITAL_COMMUNITY): Payer: Commercial Managed Care - PPO

## 2014-07-01 ENCOUNTER — Ambulatory Visit (HOSPITAL_COMMUNITY)
Admission: RE | Admit: 2014-07-01 | Discharge: 2014-07-01 | Disposition: A | Discharge status: Home / Self Care | Payer: Commercial Managed Care - PPO | Source: Ambulatory Visit | Attending: Obstetrics | Admitting: Obstetrics

## 2014-07-01 DIAGNOSIS — Z3A35 35 weeks gestation of pregnancy: Secondary | ICD-10-CM | POA: Insufficient documentation

## 2014-07-01 DIAGNOSIS — O10013 Pre-existing essential hypertension complicating pregnancy, third trimester: Secondary | ICD-10-CM | POA: Diagnosis not present

## 2014-07-01 DIAGNOSIS — Z3A34 34 weeks gestation of pregnancy: Secondary | ICD-10-CM | POA: Diagnosis not present

## 2014-07-01 LAB — OB RESULTS CONSOLE GBS: STREP GROUP B AG: NEGATIVE

## 2014-07-04 ENCOUNTER — Ambulatory Visit (HOSPITAL_COMMUNITY)
Admission: RE | Admit: 2014-07-04 | Discharge: 2014-07-04 | Disposition: A | Discharge status: Home / Self Care | Payer: Commercial Managed Care - PPO | Source: Ambulatory Visit | Attending: Obstetrics | Admitting: Obstetrics

## 2014-07-04 ENCOUNTER — Encounter (HOSPITAL_COMMUNITY): Payer: Self-pay

## 2014-07-04 DIAGNOSIS — Z3A00 Weeks of gestation of pregnancy not specified: Secondary | ICD-10-CM | POA: Insufficient documentation

## 2014-07-04 DIAGNOSIS — O09293 Supervision of pregnancy with other poor reproductive or obstetric history, third trimester: Secondary | ICD-10-CM | POA: Insufficient documentation

## 2014-07-04 DIAGNOSIS — O10011 Pre-existing essential hypertension complicating pregnancy, first trimester: Secondary | ICD-10-CM | POA: Diagnosis not present

## 2014-07-04 DIAGNOSIS — O99213 Obesity complicating pregnancy, third trimester: Secondary | ICD-10-CM | POA: Diagnosis not present

## 2014-07-04 DIAGNOSIS — O10013 Pre-existing essential hypertension complicating pregnancy, third trimester: Secondary | ICD-10-CM | POA: Insufficient documentation

## 2014-07-04 DIAGNOSIS — Z3A35 35 weeks gestation of pregnancy: Secondary | ICD-10-CM | POA: Insufficient documentation

## 2014-07-04 DIAGNOSIS — O99211 Obesity complicating pregnancy, first trimester: Secondary | ICD-10-CM | POA: Diagnosis not present

## 2014-07-04 DIAGNOSIS — O09523 Supervision of elderly multigravida, third trimester: Secondary | ICD-10-CM | POA: Diagnosis not present

## 2014-07-04 DIAGNOSIS — O09521 Supervision of elderly multigravida, first trimester: Secondary | ICD-10-CM | POA: Insufficient documentation

## 2014-07-08 ENCOUNTER — Ambulatory Visit (HOSPITAL_COMMUNITY)
Admission: RE | Admit: 2014-07-08 | Discharge: 2014-07-08 | Disposition: A | Discharge status: Home / Self Care | Payer: Commercial Managed Care - PPO | Source: Ambulatory Visit | Attending: Obstetrics | Admitting: Obstetrics

## 2014-07-08 DIAGNOSIS — Z3A35 35 weeks gestation of pregnancy: Secondary | ICD-10-CM | POA: Insufficient documentation

## 2014-07-08 DIAGNOSIS — O0940 Supervision of pregnancy with grand multiparity, unspecified trimester: Secondary | ICD-10-CM | POA: Diagnosis not present

## 2014-07-08 DIAGNOSIS — O09523 Supervision of elderly multigravida, third trimester: Secondary | ICD-10-CM | POA: Diagnosis not present

## 2014-07-08 DIAGNOSIS — O10013 Pre-existing essential hypertension complicating pregnancy, third trimester: Secondary | ICD-10-CM | POA: Diagnosis not present

## 2014-07-08 NOTE — ED Notes (Signed)
BP's this visit 191/98 183/95

## 2014-07-08 NOTE — ED Notes (Signed)
Dr. Sherrie Georgeecker reviewed BP's

## 2014-07-11 ENCOUNTER — Ambulatory Visit (HOSPITAL_COMMUNITY)
Admission: RE | Admit: 2014-07-11 | Discharge: 2014-07-11 | Disposition: A | Discharge status: Home / Self Care | Payer: Commercial Managed Care - PPO | Source: Ambulatory Visit | Attending: Obstetrics | Admitting: Obstetrics

## 2014-07-11 ENCOUNTER — Encounter (HOSPITAL_COMMUNITY): Payer: Self-pay

## 2014-07-11 ENCOUNTER — Inpatient Hospital Stay (HOSPITAL_COMMUNITY)
Admission: AD | Admit: 2014-07-11 | Discharge: 2014-07-17 | DRG: 765 | Disposition: A | Discharge status: Home / Self Care | Payer: Commercial Managed Care - PPO | Source: Ambulatory Visit | Attending: Obstetrics | Admitting: Obstetrics

## 2014-07-11 DIAGNOSIS — O09293 Supervision of pregnancy with other poor reproductive or obstetric history, third trimester: Secondary | ICD-10-CM

## 2014-07-11 DIAGNOSIS — O10013 Pre-existing essential hypertension complicating pregnancy, third trimester: Secondary | ICD-10-CM

## 2014-07-11 DIAGNOSIS — E669 Obesity, unspecified: Secondary | ICD-10-CM | POA: Insufficient documentation

## 2014-07-11 DIAGNOSIS — O1490 Unspecified pre-eclampsia, unspecified trimester: Secondary | ICD-10-CM | POA: Diagnosis present

## 2014-07-11 DIAGNOSIS — Z302 Encounter for sterilization: Secondary | ICD-10-CM

## 2014-07-11 DIAGNOSIS — O1493 Unspecified pre-eclampsia, third trimester: Secondary | ICD-10-CM

## 2014-07-11 DIAGNOSIS — O99214 Obesity complicating childbirth: Secondary | ICD-10-CM | POA: Diagnosis present

## 2014-07-11 DIAGNOSIS — O99213 Obesity complicating pregnancy, third trimester: Secondary | ICD-10-CM

## 2014-07-11 DIAGNOSIS — O09523 Supervision of elderly multigravida, third trimester: Secondary | ICD-10-CM

## 2014-07-11 DIAGNOSIS — Z3A36 36 weeks gestation of pregnancy: Secondary | ICD-10-CM | POA: Insufficient documentation

## 2014-07-11 DIAGNOSIS — O113 Pre-existing hypertension with pre-eclampsia, third trimester: Principal | ICD-10-CM | POA: Diagnosis present

## 2014-07-11 DIAGNOSIS — O42913 Preterm premature rupture of membranes, unspecified as to length of time between rupture and onset of labor, third trimester: Secondary | ICD-10-CM

## 2014-07-11 DIAGNOSIS — Z98891 History of uterine scar from previous surgery: Secondary | ICD-10-CM

## 2014-07-11 DIAGNOSIS — O1092 Unspecified pre-existing hypertension complicating childbirth: Secondary | ICD-10-CM | POA: Diagnosis present

## 2014-07-11 DIAGNOSIS — Z6841 Body Mass Index (BMI) 40.0 and over, adult: Secondary | ICD-10-CM | POA: Diagnosis not present

## 2014-07-11 DIAGNOSIS — O10913 Unspecified pre-existing hypertension complicating pregnancy, third trimester: Secondary | ICD-10-CM | POA: Diagnosis present

## 2014-07-11 LAB — ABO/RH: ABO/RH(D): A POS

## 2014-07-11 LAB — COMPREHENSIVE METABOLIC PANEL
ALT: 22 U/L (ref 0–35)
ANION GAP: 7 (ref 5–15)
AST: 27 U/L (ref 0–37)
Albumin: 2.6 g/dL — ABNORMAL LOW (ref 3.5–5.2)
Alkaline Phosphatase: 105 U/L (ref 39–117)
BUN: 5 mg/dL — ABNORMAL LOW (ref 6–23)
CHLORIDE: 102 mmol/L (ref 96–112)
CO2: 28 mmol/L (ref 19–32)
CREATININE: 0.65 mg/dL (ref 0.50–1.10)
Calcium: 8.7 mg/dL (ref 8.4–10.5)
GFR calc Af Amer: >90 mL/min (ref >90)
Glucose, Bld: 78 mg/dL (ref 70–99)
Potassium: 3.9 mmol/L (ref 3.5–5.1)
Sodium: 137 mmol/L (ref 135–145)
Total Bilirubin: 0.2 mg/dL — ABNORMAL LOW (ref 0.3–1.2)
Total Protein: 5.6 g/dL — ABNORMAL LOW (ref 6.0–8.3)

## 2014-07-11 LAB — URINALYSIS, ROUTINE W REFLEX MICROSCOPIC
Bilirubin Urine: NEGATIVE
Glucose, UA: NEGATIVE mg/dL
KETONES UR: NEGATIVE mg/dL
Leukocytes, UA: NEGATIVE
NITRITE: NEGATIVE
Protein, ur: NEGATIVE mg/dL
Specific Gravity, Urine: 1.01 (ref 1.005–1.030)
Urobilinogen, UA: 0.2 mg/dL (ref 0.0–1.0)
pH: 6.5 (ref 5.0–8.0)

## 2014-07-11 LAB — CBC
HEMATOCRIT: 32.5 % — AB (ref 36.0–46.0)
Hemoglobin: 10.9 g/dL — ABNORMAL LOW (ref 12.0–15.0)
MCH: 29.8 pg (ref 26.0–34.0)
MCHC: 33.5 g/dL (ref 30.0–36.0)
MCV: 88.8 fL (ref 78.0–100.0)
Platelets: 236 10*3/uL (ref 150–400)
RBC: 3.66 MIL/uL — ABNORMAL LOW (ref 3.87–5.11)
RDW: 14.5 % (ref 11.5–15.5)
WBC: 6 10*3/uL (ref 4.0–10.5)

## 2014-07-11 LAB — TYPE AND SCREEN
ABO/RH(D): A POS
Antibody Screen: NEGATIVE

## 2014-07-11 LAB — URINE MICROSCOPIC-ADD ON

## 2014-07-11 LAB — PROTEIN / CREATININE RATIO, URINE
CREATININE, URINE: 108 mg/dL
Protein Creatinine Ratio: 0.13 (ref 0.00–0.15)
TOTAL PROTEIN, URINE: 14 mg/dL

## 2014-07-11 LAB — LACTATE DEHYDROGENASE: LDH: 194 U/L (ref 94–250)

## 2014-07-11 LAB — URIC ACID: URIC ACID, SERUM: 4.8 mg/dL (ref 2.4–7.0)

## 2014-07-11 MED ORDER — LACTATED RINGERS IV SOLN
INTRAVENOUS | Status: DC
  Administered 2014-07-11: 100 mL/h via INTRAVENOUS
  Administered 2014-07-12: 14:00:00 via INTRAVENOUS

## 2014-07-11 MED ORDER — MAGNESIUM SULFATE 40 G IN LACTATED RINGERS - SIMPLE
2.0000 g/h | INTRAVENOUS | Status: AC
  Administered 2014-07-11: 2 g/h via INTRAVENOUS
  Filled 2014-07-11 (×2): qty 500

## 2014-07-11 MED ORDER — ONDANSETRON HCL 4 MG/2ML IJ SOLN: 4.0000 mg | Freq: Four times a day (QID) | INTRAMUSCULAR | Status: DC | PRN

## 2014-07-11 MED ORDER — OXYCODONE-ACETAMINOPHEN 5-325 MG PO TABS: 1.0000 | ORAL_TABLET | ORAL | Status: DC | PRN

## 2014-07-11 MED ORDER — LABETALOL HCL 5 MG/ML IV SOLN
20.0000 mg | INTRAVENOUS | Status: AC | PRN
  Administered 2014-07-11 (×3): 20 mg via INTRAVENOUS
  Filled 2014-07-11 (×4): qty 4

## 2014-07-11 MED ORDER — LACTATED RINGERS IV SOLN
INTRAVENOUS | Status: DC
  Administered 2014-07-11 – 2014-07-12 (×3): via INTRAVENOUS

## 2014-07-11 MED ORDER — LIDOCAINE HCL (PF) 1 % IJ SOLN: 30.0000 mL | INTRAMUSCULAR | Status: DC | PRN

## 2014-07-11 MED ORDER — NON FORMULARY: 300.0000 mg | Freq: Two times a day (BID) | Status: DC

## 2014-07-11 MED ORDER — LABETALOL HCL 200 MG PO TABS
200.0000 mg | ORAL_TABLET | Freq: Four times a day (QID) | ORAL | Status: DC
  Administered 2014-07-11: 200 mg via ORAL
  Filled 2014-07-11 (×4): qty 1

## 2014-07-11 MED ORDER — OXYCODONE-ACETAMINOPHEN 5-325 MG PO TABS: 2.0000 | ORAL_TABLET | ORAL | Status: DC | PRN

## 2014-07-11 MED ORDER — ACETAMINOPHEN 325 MG PO TABS
650.0000 mg | ORAL_TABLET | ORAL | Status: DC | PRN
  Administered 2014-07-12: 650 mg via ORAL
  Filled 2014-07-11: qty 2

## 2014-07-11 MED ORDER — OXYTOCIN 40 UNITS IN LACTATED RINGERS INFUSION - SIMPLE MED
62.5000 mL/h | INTRAVENOUS | Status: DC
Start: 2014-07-11 — End: 2014-07-12

## 2014-07-11 MED ORDER — CITRIC ACID-SODIUM CITRATE 334-500 MG/5ML PO SOLN
30.0000 mL | ORAL | Status: DC | PRN
  Administered 2014-07-12: 30 mL via ORAL
  Filled 2014-07-11: qty 15

## 2014-07-11 MED ORDER — LACTATED RINGERS IV SOLN: 500.0000 mL | INTRAVENOUS | Status: DC | PRN

## 2014-07-11 MED ORDER — LABETALOL HCL 5 MG/ML IV SOLN
40.0000 mg | Freq: Once | INTRAVENOUS | Status: AC
  Administered 2014-07-11: 40 mg via INTRAVENOUS
  Filled 2014-07-11: qty 8

## 2014-07-11 MED ORDER — MAGNESIUM SULFATE BOLUS VIA INFUSION
4.0000 g | Freq: Once | INTRAVENOUS | Status: AC
  Administered 2014-07-11: 4 g via INTRAVENOUS
  Filled 2014-07-11: qty 500

## 2014-07-11 MED ORDER — LABETALOL HCL 5 MG/ML IV SOLN
40.0000 mg | INTRAVENOUS | Status: AC | PRN
  Administered 2014-07-11 (×3): 40 mg via INTRAVENOUS
  Filled 2014-07-11 (×3): qty 8

## 2014-07-11 MED ORDER — BUTORPHANOL TARTRATE 1 MG/ML IJ SOLN
1.0000 mg | INTRAMUSCULAR | Status: DC | PRN
  Administered 2014-07-11: 1 mg via INTRAVENOUS
  Filled 2014-07-11: qty 1

## 2014-07-11 MED ORDER — TERBUTALINE SULFATE 1 MG/ML IJ SOLN
0.2500 mg | Freq: Once | INTRAMUSCULAR | Status: AC | PRN
Start: 2014-07-11 — End: 2014-07-11

## 2014-07-11 MED ORDER — OXYTOCIN BOLUS FROM INFUSION: 500.0000 mL | INTRAVENOUS | Status: DC

## 2014-07-11 MED ORDER — HYDRALAZINE HCL 20 MG/ML IJ SOLN
10.0000 mg | INTRAMUSCULAR | Status: DC | PRN
  Administered 2014-07-11 – 2014-07-12 (×5): 10 mg via INTRAVENOUS
  Filled 2014-07-11 (×3): qty 1

## 2014-07-11 MED ORDER — LABETALOL HCL 200 MG PO TABS
400.0000 mg | ORAL_TABLET | Freq: Four times a day (QID) | ORAL | Status: DC
Start: 2014-07-12 — End: 2014-07-12
  Administered 2014-07-12 (×3): 400 mg via ORAL
  Filled 2014-07-11 (×7): qty 2

## 2014-07-11 MED ORDER — RANITIDINE HCL 150 MG PO TABS
300.0000 mg | ORAL_TABLET | Freq: Every day | ORAL | Status: DC
  Administered 2014-07-11: 300 mg via ORAL
  Administered 2014-07-12 – 2014-07-13 (×2): 150 mg via ORAL
  Filled 2014-07-11: qty 2

## 2014-07-11 MED ORDER — LABETALOL HCL 200 MG PO TABS
200.0000 mg | ORAL_TABLET | Freq: Once | ORAL | Status: AC
  Administered 2014-07-11: 200 mg via ORAL
  Filled 2014-07-11: qty 1

## 2014-07-11 MED ORDER — OXYTOCIN 40 UNITS IN LACTATED RINGERS INFUSION - SIMPLE MED
1.0000 m[IU]/min | INTRAVENOUS | Status: DC
  Administered 2014-07-11: 2 m[IU]/min via INTRAVENOUS
  Filled 2014-07-11: qty 1000

## 2014-07-11 NOTE — ED Notes (Signed)
Dr. Claudean SeveranceWhitecar made aware of BP

## 2014-07-11 NOTE — Progress Notes (Signed)
No orders given.  MD happy with bp

## 2014-07-11 NOTE — Progress Notes (Signed)
In bathroom

## 2014-07-11 NOTE — Progress Notes (Signed)
Dr Gaynell FaceMarshall notified of bp.  See orders.  Dr Gaynell FaceMarshall also notified that RN is unable to locate GBS.  Dr Gaynell FaceMarshall will bring results to hospital

## 2014-07-11 NOTE — Progress Notes (Signed)
Reported bp to Dr Gaynell FaceMarshall.  No orders given for bp.  Also requested order for ranitidine

## 2014-07-11 NOTE — MAU Note (Addendum)
Sent from MFM- BP elevation, hx of CHTN.  Denies HA, visual changes, or epigastric pain.  Does have swelling. Some nausea and vomiting this morning

## 2014-07-11 NOTE — MAU Note (Signed)
Urine in lab 

## 2014-07-11 NOTE — MAU Provider Note (Signed)
Chief Complaint:  Hypertension   First Provider Initiated Contact with Patient 07/11/14 1102      HPI: Jacqueline Rowe is a 44 y.o. G4P0030 at [redacted]w[redacted]d who presents to maternity admissions reporting elevated BP in pregnancy. She also reports mild h/a with onset since arrival in MAU.  She reports CHTN on labetalol prior to pregnancy and has continued labetalol 200 mg BID throughout pregnancy. Her blood pressures have been normal until the last week.  She took her dose this morning as scheduled.  She reports good fetal movement, denies epigastric pain, visual disturbances, LOF, vaginal bleeding, vaginal itching/burning, urinary symptoms, dizziness, n/v, or fever/chills.     Past Medical History: Past Medical History  Diagnosis Date  . Hypertension     Past obstetric history: OB History  Gravida Para Term Preterm AB SAB TAB Ectopic Multiple Living     # Outcome Date GA Lbr Len/2nd Weight Sex Delivery Anes PTL Lv  4 Current           3 AB  [redacted]w[redacted]d            Comments: PROM  2 AB           1 AB               Past Surgical History: Past Surgical History  Procedure Laterality Date  . Breast surgery      Family History: Family History  Problem Relation Age of Onset  . Migraines Neg Hx   . High blood pressure Mother   . High blood pressure Maternal Aunt   . High blood pressure Sister     Social History: History  Substance Use Topics  . Smoking status: Former Games developer  . Smokeless tobacco: Not on file  . Alcohol Use: Yes     Comment: before pregnant    Allergies: No Known Allergies  Meds:  Prescriptions prior to admission  Medication Sig Dispense Refill Last Dose  . albuterol (PROVENTIL HFA;VENTOLIN HFA) 108 (90 BASE) MCG/ACT inhaler Inhale 1-2 puffs into the lungs every 6 (six) hours as needed for wheezing or shortness of breath.   rescue  . labetalol (NORMODYNE) 200 MG tablet Take 200 mg by mouth 3 (three) times daily.   07/11/2014 at 6:00am  .  Prenatal Vit-Fe Fumarate-FA (MULTIVITAMIN-PRENATAL) 27-0.8 MG TABS tablet Take 1 tablet by mouth daily at 12 noon.   07/11/2014 at Unknown time  . ranitidine (ZANTAC) 300 MG tablet Take 300 mg by mouth at bedtime.   07/10/2014 at Unknown time    ROS: Pertinent findings in history of present illness.  Physical Exam  Blood pressure 143/84, pulse 82, height 5' 5.5" (1.664 m), weight 148.78 kg (328 lb), last menstrual period 10/27/2013.  Patient Vitals for the past 24 hrs:  BP Pulse Height Weight  07/11/14 1304 143/84 mmHg 82 - -  07/11/14 1249 166/83 mmHg 75 - -  07/11/14 1243 164/88 mmHg 82 - -  07/11/14 1235 150/82 mmHg 83 - -  07/11/14 1220 173/97 mmHg 88 - -  07/11/14 1205 181/93 mmHg 81 - -  07/11/14 1149 170/72 mmHg 82 - -  07/11/14 1134 180/88 mmHg 93 - -  07/11/14 1119 174/81 mmHg 95 - -  07/11/14 1109 150/74 mmHg 94 - -  07/11/14 1049 176/85 mmHg 96 - -  07/11/14 1033 171/95 mmHg 98 - -  07/11/14 1027 167/87 mmHg 96 - -  07/11/14 1006 - - 5'  5.5" (1.664 m) (!) 148.78 kg (328 lb)   GENERAL: Well-developed, well-nourished female in no acute distress.  HEENT: normocephalic HEART: normal rate, heart sounds, regular rhythm RESP: normal effort, lung sounds clear and equal bilaterally ABDOMEN: Soft, non-tender, gravid appropriate for gestational age EXTREMITIES: Nontender, no edema NEURO: alert and oriented     FHT:  Baseline 145, moderate variability, accelerations present, no decelerations Contractions: None on toco or to palpation   Labs: Results for orders placed or performed during the hospital encounter of 07/11/14 (from the past 24 hour(s))  Protein / creatinine ratio, urine     Status: None   Collection Time: 07/11/14 10:05 AM  Result Value Ref Range   Creatinine, Urine 108.00 mg/dL   Total Protein, Urine 14 mg/dL   Protein Creatinine Ratio 0.13 0.00 - 0.15  Urinalysis, Routine w reflex microscopic     Status: Abnormal   Collection Time: 07/11/14 10:05 AM  Result  Value Ref Range   Color, Urine YELLOW YELLOW   APPearance CLEAR CLEAR   Specific Gravity, Urine 1.010 1.005 - 1.030   pH 6.5 5.0 - 8.0   Glucose, UA NEGATIVE NEGATIVE mg/dL   Hgb urine dipstick TRACE (A) NEGATIVE   Bilirubin Urine NEGATIVE NEGATIVE   Ketones, ur NEGATIVE NEGATIVE mg/dL   Protein, ur NEGATIVE NEGATIVE mg/dL   Urobilinogen, UA 0.2 0.0 - 1.0 mg/dL   Nitrite NEGATIVE NEGATIVE   Leukocytes, UA NEGATIVE NEGATIVE  Urine microscopic-add on     Status: Abnormal   Collection Time: 07/11/14 10:05 AM  Result Value Ref Range   Squamous Epithelial / LPF RARE RARE   Bacteria, UA FEW (A) RARE  CBC     Status: Abnormal   Collection Time: 07/11/14 11:30 AM  Result Value Ref Range   WBC 6.0 4.0 - 10.5 K/uL   RBC 3.66 (L) 3.87 - 5.11 MIL/uL   Hemoglobin 10.9 (L) 12.0 - 15.0 g/dL   HCT 60.4 (L) 54.0 - 98.1 %   MCV 88.8 78.0 - 100.0 fL   MCH 29.8 26.0 - 34.0 pg   MCHC 33.5 30.0 - 36.0 g/dL   RDW 19.1 47.8 - 29.5 %   Platelets 236 150 - 400 K/uL  Comprehensive metabolic panel     Status: Abnormal   Collection Time: 07/11/14 11:30 AM  Result Value Ref Range   Sodium 137 135 - 145 mmol/L   Potassium 3.9 3.5 - 5.1 mmol/L   Chloride 102 96 - 112 mmol/L   CO2 28 19 - 32 mmol/L   Glucose, Bld 78 70 - 99 mg/dL   BUN 5 (L) 6 - 23 mg/dL   Creatinine, Ser 6.21 0.50 - 1.10 mg/dL   Calcium 8.7 8.4 - 30.8 mg/dL   Total Protein 5.6 (L) 6.0 - 8.3 g/dL   Albumin 2.6 (L) 3.5 - 5.2 g/dL   AST 27 0 - 37 U/L   ALT 22 0 - 35 U/L   Alkaline Phosphatase 105 39 - 117 U/L   Total Bilirubin 0.2 (L) 0.3 - 1.2 mg/dL   GFR calc non Af Amer >90 >90 mL/min   GFR calc Af Amer >90 >90 mL/min   Anion gap 7 5 - 15  Uric acid     Status: None   Collection Time: 07/11/14 11:30 AM  Result Value Ref Range   Uric Acid, Serum 4.8 2.4 - 7.0 mg/dL  Lactate dehydrogenase     Status: None   Collection Time: 07/11/14 11:30 AM  Result Value Ref  Range   LDH 194 94 - 250 U/L    ED Course CBC, CMP, uric acid,  LDH, P/C ratio, U/A LR @ 125 Labetalol 20 mg Q 10 min x 3 doses PRN, all 3 doses given r/t elevated BP  Assessment: 1. Preeclampsia, third trimester     Plan: [facility]arshall [facility]    Medication List    ASK your doctor about these medications        albuterol 108 (90 BASE) MCG/ACT inhaler  Commonly known as:  PROVENTIL HFA;VENTOLIN HFA  Inhale 1-2 puffs into the lungs every 6 (six) hours as needed for wheezing or shortness of breath.     labetalol 200 MG tablet  Commonly known as:  NORMODYNE  Take 200 mg by mouth 3 (three) times daily.     multivitamin-prenatal 27-0.8 MG Tabs tablet  Take 1 tablet by mouth daily at 12 noon.     ranitidine 300 MG tablet  Commonly known as:  ZANTAC  Take 300 mg by mouth at bedtime.        Sharen CounterLisa Leftwich-Kirby Certified Nurse-Midwife 07/11/2014 1:43 PM

## 2014-07-11 NOTE — ED Notes (Signed)
Pt to be evaluated in MAU. Pt states that she needs to pick her husband up from work before going to MAU Dr. Claudean SeveranceWhitecar aware. Report called to [place] DayJolynn in MAU.

## 2014-07-12 ENCOUNTER — Inpatient Hospital Stay (HOSPITAL_COMMUNITY): Payer: Commercial Managed Care - PPO | Admitting: Anesthesiology

## 2014-07-12 ENCOUNTER — Encounter (HOSPITAL_COMMUNITY): Payer: Self-pay | Admitting: *Deleted

## 2014-07-12 ENCOUNTER — Encounter (HOSPITAL_COMMUNITY)
Admission: AD | Disposition: A | Discharge status: Home / Self Care | Payer: Self-pay | Source: Ambulatory Visit | Attending: Obstetrics

## 2014-07-12 DIAGNOSIS — Z98891 History of uterine scar from previous surgery: Secondary | ICD-10-CM

## 2014-07-12 LAB — CBC
HCT: 34.9 % — ABNORMAL LOW (ref 36.0–46.0)
HEMATOCRIT: 33.6 % — AB (ref 36.0–46.0)
HEMATOCRIT: 35.3 % — AB (ref 36.0–46.0)
Hemoglobin: 11.3 g/dL — ABNORMAL LOW (ref 12.0–15.0)
Hemoglobin: 11.7 g/dL — ABNORMAL LOW (ref 12.0–15.0)
Hemoglobin: 11.7 g/dL — ABNORMAL LOW (ref 12.0–15.0)
MCH: 29.9 pg (ref 26.0–34.0)
MCH: 29.5 pg (ref 26.0–34.0)
MCH: 29.8 pg (ref 26.0–34.0)
MCHC: 33.6 g/dL (ref 30.0–36.0)
MCHC: 33.1 g/dL (ref 30.0–36.0)
MCHC: 33.5 g/dL (ref 30.0–36.0)
MCV: 88.9 fL (ref 78.0–100.0)
MCV: 89.1 fL (ref 78.0–100.0)
MCV: 89 fL (ref 78.0–100.0)
PLATELETS: 259 10*3/uL (ref 150–400)
PLATELETS: 263 10*3/uL (ref 150–400)
Platelets: 248 10*3/uL (ref 150–400)
RBC: 3.78 MIL/uL — AB (ref 3.87–5.11)
RBC: 3.96 MIL/uL (ref 3.87–5.11)
RBC: 3.92 MIL/uL (ref 3.87–5.11)
RDW: 14.7 % (ref 11.5–15.5)
RDW: 14.7 % (ref 11.5–15.5)
RDW: 14.9 % (ref 11.5–15.5)
WBC: 6.2 10*3/uL (ref 4.0–10.5)
WBC: 6.2 10*3/uL (ref 4.0–10.5)
WBC: 7.5 10*3/uL (ref 4.0–10.5)

## 2014-07-12 LAB — RPR: RPR Ser Ql: NONREACTIVE

## 2014-07-12 SURGERY — Surgical Case
Anesthesia: Epidural | Site: Abdomen

## 2014-07-12 MED ORDER — LANOLIN HYDROUS EX OINT: 1.0000 "application " | TOPICAL_OINTMENT | CUTANEOUS | Status: DC | PRN

## 2014-07-12 MED ORDER — FENTANYL CITRATE 0.05 MG/ML IJ SOLN
INTRAMUSCULAR | Status: AC
  Administered 2014-07-12: 50 ug via INTRAVENOUS
  Filled 2014-07-12: qty 2

## 2014-07-12 MED ORDER — ZOLPIDEM TARTRATE 5 MG PO TABS: 5.0000 mg | ORAL_TABLET | Freq: Every evening | ORAL | Status: DC | PRN

## 2014-07-12 MED ORDER — MEPERIDINE HCL 25 MG/ML IJ SOLN: 6.2500 mg | INTRAMUSCULAR | Status: DC | PRN

## 2014-07-12 MED ORDER — DIPHENHYDRAMINE HCL 50 MG/ML IJ SOLN: 12.5000 mg | INTRAMUSCULAR | Status: DC | PRN

## 2014-07-12 MED ORDER — SODIUM CHLORIDE 0.9 % IV SOLN
INTRAVENOUS | Status: DC
  Filled 2014-07-12 (×3): qty 20

## 2014-07-12 MED ORDER — DEXTROSE 5 % IV SOLN
3.0000 g | Freq: Once | INTRAVENOUS | Status: AC
  Administered 2014-07-12: 3 g via INTRAVENOUS
  Filled 2014-07-12: qty 3000

## 2014-07-12 MED ORDER — KETOROLAC TROMETHAMINE 30 MG/ML IJ SOLN: 30.0000 mg | Freq: Four times a day (QID) | INTRAMUSCULAR | Status: AC | PRN

## 2014-07-12 MED ORDER — ALBUTEROL SULFATE (2.5 MG/3ML) 0.083% IN NEBU
3.0000 mL | INHALATION_SOLUTION | Freq: Four times a day (QID) | RESPIRATORY_TRACT | Status: DC | PRN
  Administered 2014-07-12: 3 mL via RESPIRATORY_TRACT
  Filled 2014-07-12: qty 3

## 2014-07-12 MED ORDER — SIMETHICONE 80 MG PO CHEW
80.0000 mg | CHEWABLE_TABLET | ORAL | Status: DC | PRN
  Filled 2014-07-12: qty 1

## 2014-07-12 MED ORDER — DIBUCAINE 1 % RE OINT: 1.0000 "application " | TOPICAL_OINTMENT | RECTAL | Status: DC | PRN

## 2014-07-12 MED ORDER — ALBUTEROL SULFATE (2.5 MG/3ML) 0.083% IN NEBU: 3.0000 mL | INHALATION_SOLUTION | Freq: Four times a day (QID) | RESPIRATORY_TRACT | Status: DC | PRN

## 2014-07-12 MED ORDER — ACETAMINOPHEN 325 MG PO TABS
650.0000 mg | ORAL_TABLET | ORAL | Status: DC | PRN
  Administered 2014-07-16 (×2): 650 mg via ORAL
  Filled 2014-07-12 (×2): qty 2

## 2014-07-12 MED ORDER — MORPHINE SULFATE 0.5 MG/ML IJ SOLN
INTRAMUSCULAR | Status: AC
  Filled 2014-07-12: qty 10

## 2014-07-12 MED ORDER — OXYTOCIN 10 UNIT/ML IJ SOLN
40.0000 [IU] | INTRAVENOUS | Status: DC | PRN
  Administered 2014-07-12: 40 [IU] via INTRAVENOUS

## 2014-07-12 MED ORDER — SODIUM CHLORIDE 0.9 % IJ SOLN: 3.0000 mL | INTRAMUSCULAR | Status: DC | PRN

## 2014-07-12 MED ORDER — NALOXONE HCL 1 MG/ML IJ SOLN: 1.0000 ug/kg/h | INTRAVENOUS | Status: DC | PRN

## 2014-07-12 MED ORDER — NALOXONE HCL 0.4 MG/ML IJ SOLN: 0.4000 mg | INTRAMUSCULAR | Status: DC | PRN

## 2014-07-12 MED ORDER — TETANUS-DIPHTH-ACELL PERTUSSIS 5-2.5-18.5 LF-MCG/0.5 IM SUSP
0.5000 mL | Freq: Once | INTRAMUSCULAR | Status: DC
  Filled 2014-07-12: qty 0.5

## 2014-07-12 MED ORDER — FENTANYL CITRATE 0.05 MG/ML IJ SOLN
INTRAMUSCULAR | Status: AC
  Filled 2014-07-12: qty 2

## 2014-07-12 MED ORDER — DIPHENHYDRAMINE HCL 25 MG PO CAPS: 25.0000 mg | ORAL_CAPSULE | Freq: Four times a day (QID) | ORAL | Status: DC | PRN

## 2014-07-12 MED ORDER — LIDOCAINE-EPINEPHRINE 2 %-1:100000 IJ SOLN
INTRAMUSCULAR | Status: DC | PRN
  Administered 2014-07-12 (×2): 5 mL via INTRADERMAL

## 2014-07-12 MED ORDER — NALBUPHINE HCL 10 MG/ML IJ SOLN: 5.0000 mg | Freq: Once | INTRAMUSCULAR | Status: AC | PRN

## 2014-07-12 MED ORDER — SCOPOLAMINE 1 MG/3DAYS TD PT72
MEDICATED_PATCH | TRANSDERMAL | Status: AC
  Filled 2014-07-12: qty 1

## 2014-07-12 MED ORDER — SENNOSIDES-DOCUSATE SODIUM 8.6-50 MG PO TABS
2.0000 | ORAL_TABLET | ORAL | Status: DC
  Administered 2014-07-13 – 2014-07-16 (×5): 2 via ORAL
  Filled 2014-07-12 (×5): qty 2

## 2014-07-12 MED ORDER — PRENATAL MULTIVITAMIN CH: 1.0000 | ORAL_TABLET | Freq: Every day | ORAL | Status: DC

## 2014-07-12 MED ORDER — MAGNESIUM SULFATE 40 G IN LACTATED RINGERS - SIMPLE
2.0000 g/h | INTRAVENOUS | Status: DC
  Administered 2014-07-13: 2 g/h via INTRAVENOUS
  Filled 2014-07-12 (×2): qty 500

## 2014-07-12 MED ORDER — SCOPOLAMINE 1 MG/3DAYS TD PT72: 1.0000 | MEDICATED_PATCH | Freq: Once | TRANSDERMAL | Status: DC

## 2014-07-12 MED ORDER — MENTHOL 3 MG MT LOZG: 1.0000 | LOZENGE | OROMUCOSAL | Status: DC | PRN

## 2014-07-12 MED ORDER — MAGNESIUM SULFATE 40 G IN LACTATED RINGERS - SIMPLE
2.0000 g/h | INTRAVENOUS | Status: DC
  Administered 2014-07-12: 2 g/h via INTRAVENOUS

## 2014-07-12 MED ORDER — OXYCODONE-ACETAMINOPHEN 5-325 MG PO TABS
1.0000 | ORAL_TABLET | ORAL | Status: DC | PRN
  Administered 2014-07-13 – 2014-07-17 (×6): 1 via ORAL
  Filled 2014-07-12 (×6): qty 1

## 2014-07-12 MED ORDER — ONDANSETRON HCL 4 MG/2ML IJ SOLN: 4.0000 mg | Freq: Three times a day (TID) | INTRAMUSCULAR | Status: DC | PRN

## 2014-07-12 MED ORDER — ONDANSETRON HCL 4 MG/2ML IJ SOLN
INTRAMUSCULAR | Status: AC
  Filled 2014-07-12: qty 2

## 2014-07-12 MED ORDER — PHENYLEPHRINE 8 MG IN D5W 100 ML (0.08MG/ML) PREMIX OPTIME
INJECTION | INTRAVENOUS | Status: DC | PRN
  Administered 2014-07-12: 20 ug/min via INTRAVENOUS

## 2014-07-12 MED ORDER — IBUPROFEN 600 MG PO TABS
600.0000 mg | ORAL_TABLET | Freq: Four times a day (QID) | ORAL | Status: DC
Start: 2014-07-13 — End: 2014-07-17
  Administered 2014-07-13 – 2014-07-15 (×10): 600 mg via ORAL
  Filled 2014-07-12 (×17): qty 1

## 2014-07-12 MED ORDER — FENTANYL CITRATE 0.05 MG/ML IJ SOLN
25.0000 ug | INTRAMUSCULAR | Status: DC | PRN
  Administered 2014-07-12 (×4): 50 ug via INTRAVENOUS

## 2014-07-12 MED ORDER — LACTATED RINGERS IV SOLN
INTRAVENOUS | Status: DC
Start: 2014-07-12 — End: 2014-07-17
  Administered 2014-07-12 – 2014-07-13 (×2): via INTRAVENOUS

## 2014-07-12 MED ORDER — OXYCODONE-ACETAMINOPHEN 5-325 MG PO TABS
2.0000 | ORAL_TABLET | ORAL | Status: DC | PRN
  Administered 2014-07-13 – 2014-07-17 (×6): 2 via ORAL
  Filled 2014-07-12 (×6): qty 2

## 2014-07-12 MED ORDER — ACETAMINOPHEN 500 MG PO TABS
1000.0000 mg | ORAL_TABLET | Freq: Four times a day (QID) | ORAL | Status: AC
  Administered 2014-07-12 – 2014-07-13 (×4): 1000 mg via ORAL
  Filled 2014-07-12 (×4): qty 2

## 2014-07-12 MED ORDER — SIMETHICONE 80 MG PO CHEW
80.0000 mg | CHEWABLE_TABLET | Freq: Three times a day (TID) | ORAL | Status: DC
  Administered 2014-07-12 – 2014-07-17 (×13): 80 mg via ORAL
  Filled 2014-07-12 (×13): qty 1

## 2014-07-12 MED ORDER — SCOPOLAMINE 1 MG/3DAYS TD PT72
MEDICATED_PATCH | TRANSDERMAL | Status: DC | PRN
  Administered 2014-07-12: 1 via TRANSDERMAL

## 2014-07-12 MED ORDER — LABETALOL HCL 200 MG PO TABS
200.0000 mg | ORAL_TABLET | Freq: Three times a day (TID) | ORAL | Status: DC
  Administered 2014-07-13 (×2): 200 mg via ORAL
  Filled 2014-07-12 (×5): qty 1

## 2014-07-12 MED ORDER — NALBUPHINE HCL 10 MG/ML IJ SOLN: 5.0000 mg | INTRAMUSCULAR | Status: DC | PRN

## 2014-07-12 MED ORDER — DIPHENHYDRAMINE HCL 25 MG PO CAPS: 25.0000 mg | ORAL_CAPSULE | ORAL | Status: DC | PRN

## 2014-07-12 MED ORDER — WITCH HAZEL-GLYCERIN EX PADS
1.0000 | MEDICATED_PAD | CUTANEOUS | Status: DC | PRN
Start: 2014-07-12 — End: 2014-07-17

## 2014-07-12 MED ORDER — LIDOCAINE HCL 1 % IJ SOLN
INTRAMUSCULAR | Status: AC
  Filled 2014-07-12: qty 20

## 2014-07-12 MED ORDER — OXYTOCIN 40 UNITS IN LACTATED RINGERS INFUSION - SIMPLE MED: 62.5000 mL/h | INTRAVENOUS | Status: AC

## 2014-07-12 MED ORDER — MORPHINE SULFATE (PF) 0.5 MG/ML IJ SOLN
INTRAMUSCULAR | Status: DC | PRN
  Administered 2014-07-12: .2 mg via INTRATHECAL

## 2014-07-12 MED ORDER — SIMETHICONE 80 MG PO CHEW
80.0000 mg | CHEWABLE_TABLET | ORAL | Status: DC
  Administered 2014-07-13 – 2014-07-16 (×5): 80 mg via ORAL
  Filled 2014-07-12 (×5): qty 1

## 2014-07-12 MED ORDER — BUPIVACAINE IN DEXTROSE 0.75-8.25 % IT SOLN
INTRATHECAL | Status: DC | PRN
  Administered 2014-07-12: 1.2 mL via INTRATHECAL

## 2014-07-12 MED ORDER — ONDANSETRON HCL 4 MG/2ML IJ SOLN: 4.0000 mg | Freq: Once | INTRAMUSCULAR | Status: DC | PRN

## 2014-07-12 MED ORDER — FENTANYL CITRATE 0.05 MG/ML IJ SOLN
INTRAMUSCULAR | Status: DC | PRN
  Administered 2014-07-12: 10 ug via INTRATHECAL
  Administered 2014-07-12: 15 ug via INTRAVENOUS

## 2014-07-12 MED ORDER — PRENATAL MULTIVITAMIN CH
1.0000 | ORAL_TABLET | Freq: Every day | ORAL | Status: DC
  Administered 2014-07-13 – 2014-07-17 (×5): 1 via ORAL
  Filled 2014-07-12 (×6): qty 1

## 2014-07-12 MED ORDER — ONDANSETRON HCL 4 MG/2ML IJ SOLN
INTRAMUSCULAR | Status: DC | PRN
  Administered 2014-07-12: 4 mg via INTRAVENOUS

## 2014-07-12 MED ORDER — OXYTOCIN 10 UNIT/ML IJ SOLN
INTRAMUSCULAR | Status: AC
  Filled 2014-07-12: qty 4

## 2014-07-12 SURGICAL SUPPLY — 33 items
CLAMP CORD UMBIL (MISCELLANEOUS) IMPLANT
CLOTH BEACON ORANGE TIMEOUT ST (SAFETY) ×3 IMPLANT
DRAPE SHEET LG 3/4 BI-LAMINATE (DRAPES) IMPLANT
DRESSING DISP NPWT PICO 4X12 (MISCELLANEOUS) ×3 IMPLANT
DRSG OPSITE POSTOP 4X10 (GAUZE/BANDAGES/DRESSINGS) ×3 IMPLANT
DURAPREP 26ML APPLICATOR (WOUND CARE) ×3 IMPLANT
ELECT REM PT RETURN 9FT ADLT (ELECTROSURGICAL) ×3
ELECTRODE REM PT RTRN 9FT ADLT (ELECTROSURGICAL) ×1 IMPLANT
EXTRACTOR VACUUM M CUP 4 TUBE (SUCTIONS) IMPLANT
EXTRACTOR VACUUM M CUP 4' TUBE (SUCTIONS)
GLOVE BIO SURGEON STRL SZ8.5 (GLOVE) ×3 IMPLANT
GOWN STRL REUS W/TWL 2XL LVL3 (GOWN DISPOSABLE) ×3 IMPLANT
GOWN STRL REUS W/TWL LRG LVL3 (GOWN DISPOSABLE) ×3 IMPLANT
KIT ABG SYR 3ML LUER SLIP (SYRINGE) IMPLANT
LIQUID BAND (GAUZE/BANDAGES/DRESSINGS) ×3 IMPLANT
NEEDLE HYPO 25X5/8 SAFETYGLIDE (NEEDLE) IMPLANT
NS IRRIG 1000ML POUR BTL (IV SOLUTION) ×3 IMPLANT
PACK C SECTION WH (CUSTOM PROCEDURE TRAY) ×3 IMPLANT
PAD OB MATERNITY 4.3X12.25 (PERSONAL CARE ITEMS) ×3 IMPLANT
STAPLER VISISTAT 35W (STAPLE) IMPLANT
SUT CHROMIC 0 CT 802H (SUTURE) ×3 IMPLANT
SUT CHROMIC 0 MO4 CR (SUTURE) IMPLANT
SUT CHROMIC 1 CTX 36 (SUTURE) ×6 IMPLANT
SUT CHROMIC 2 0 SH (SUTURE) ×3 IMPLANT
SUT GUT PLAIN 0 CT-3 TAN 27 (SUTURE) IMPLANT
SUT MON AB 4-0 PS1 27 (SUTURE) ×3 IMPLANT
SUT PDS AB 0 CTX 36 PDP370T (SUTURE) IMPLANT
SUT VIC AB 0 CT1 18XCR BRD8 (SUTURE) IMPLANT
SUT VIC AB 0 CT1 8-18 (SUTURE)
SUT VIC AB 0 CTX 36 (SUTURE) ×6
SUT VIC AB 0 CTX36XBRD ANBCTRL (SUTURE) ×2 IMPLANT
TOWEL OR 17X24 6PK STRL BLUE (TOWEL DISPOSABLE) ×3 IMPLANT
TRAY FOLEY CATH 14FR (SET/KITS/TRAYS/PACK) ×3 IMPLANT

## 2014-07-12 NOTE — H&P (Signed)
This is Dr. Francoise CeoBernard Marshall dictating the history and physical on  Jacqueline Rowe she's a 44 year old gravida 4 para 0030 who's had 2 spontaneous second trimester abortions and she is now [facility] 36 weeks [facility] 6 [facility] she has been followed by MFM because of chronic hypertension her blood pressures have been controlled with labetalol 200 mg 4 times a day until 3 days ago when it was noted that her blood pressures 160/100 and check the the labetalol was increased to 204 times a day from 3 times a day yesterday she was seen by MFM and blood pressure was a high of 190/107 and she was sent to MAU and her PIH labs were normal and she had 135 g per 24-hour urine protein and in consultation with MFM it was decided should've brought in for induction she was having spontaneous contractions and Cytotec could not be used so she was started on Pitocin since she has been admitted her blood pressures have been markedly elevated and her labetalol was increased 400 mg every by mouth every 6 hours and she received a total of 7 doses of IV labetalol 40 mg and this did not provide good control so she was switched to hydralazine 10 mg IV for blood pressure 160 105 or greater that she receive a total of 7 doses of labetalol and so far 3 doses of hydralazine her last blood pressure was 152/75 the patient states that 2 weeks ago she developed cold and has had shortness of breath since her oxygen saturations 90-98 on room air and the patient also weighs 326 pounds on admission her cervix was 1 cm 50% and the vertex floating her most recent pelvic exam was unchanged her Pitocin is now at 16 mU/m and she is contracting mildly of 3 minutes plan is to continue Pitocin Past surgical surgical history negative Past medical history chronic hypertension Social history negative System review massive obesity and shortness of breath otherwise negative Physical exam 326 bone pregnant lady at 36 weeks and 6 days HEENT negative Breasts  pendulous Lungs clear heart regular rhythm no murmurs no gallops Abdomen distended with a single frontal pregnancy estimated fetal weight by MFM ultrasound 7 lbs. 10 oz. Pelvic as described above Extremities 3+ pedal edema normoreflexic

## 2014-07-12 NOTE — Op Note (Signed)
Preop diagnosis severe chronic hypertension with superimposed preeclampsia Postop diagnosis is same Surgeon Dr. Francoise CeoBernard Marshall First assistant Dr. cousins Anesthesia epidural Procedure patient placed on the operating table in the supine position abdomen prepped and draped bladder emptied  with a Foley catheter a transverse suprapubic incision made carried  Down  to the rectus fascia fascia cleaned and incised the length of the incision recti muscles retracted laterally peritoneum incised longitudinally transverse incision made on the visceroperitoneum above the bladder and the bladder mobilized inferiorly transverse lower uterine incision made fluid clear patient delivered from the Op  position of a female Apgar 9 and 9 the placenta was posterior fundal removed manually and sent to pathology uterine cavity clean with dry laps uterine incision closed in one layer with continuous suture of #1 chromic hemostasis satisfactory bladder flap reattached   right tube grasped in the midportion with a Babcock clamp 0 plain suture placed in the mesial salpinx below the portion of tube within the clamp and approximately 1 inch   transected the procedure done in a similar fashion on the other side lap and sponge counts correct abdomen closed in layers peritoneum continuous with of 0 chromic fascia continuous with of 0 Dexon skin closed with staples    blood loss 800 cc

## 2014-07-12 NOTE — Progress Notes (Signed)
Patient transferred to ICU with orders to restart her epidural, orders released for epidural infusion report given to Idalia NeedlePaige, RN. Paige asked to have Dr. Gentry RochJudd to please call her epidural can not be started and continued in the ICU.

## 2014-07-12 NOTE — Progress Notes (Signed)
Epidural catheter removed and intact   

## 2014-07-12 NOTE — Progress Notes (Signed)
Spoke with Dr. Gentry RochJudd, MDA regarding pts POC for pain control.  Will plan to manage per routine post C-Section orders.  CBC ordered.  Will pull epidural if platelets are >100, but call MDA if <100. Discussed with night shift RN.

## 2014-07-12 NOTE — Progress Notes (Signed)
Dr. Gaynell FaceMarshall called and updated on patient's status.  Informed of patient's complaint of shortness of breath and coarse breath sounds on right side with wheezing in right lower lobe.  Pulse ox 94-98%. Order for albuterol obtained.  MD updated of patient's blood pressure and administration of hydralazine 10mg  x 3 for total of 30mg .  Informed of contraction pattern and SVE of 1/5/-3.

## 2014-07-12 NOTE — Progress Notes (Signed)
Patient ID: [name], female   DOB: [birth year], 44 y.o.   MRN: [id number] Since the last examination patient received hydralazine 10 mg at 4 AM because of blood pressure well 170 her contractions a mild and she has no more shortness of breath [place] remains good she put total both 300-400 cc her urine an hour we'll continue Pitocin today

## 2014-07-12 NOTE — Anesthesia Postprocedure Evaluation (Signed)
  Anesthesia Post-op Note  Patient: Jacqueline Rowe  Procedure(s) Performed: Procedure(s) (LRB): CESAREAN SECTION (N/A)  Patient Location: PACU  Anesthesia Type: Epidural  Level of Consciousness: awake and alert   Airway and Oxygen Therapy: Patient Spontanous Breathing  Post-op Pain: mild  Post-op Assessment: Post-op Vital signs reviewed, Patient's Cardiovascular Status Stable, Respiratory Function Stable, Patent Airway and No signs of Nausea or vomiting  Last Vitals:  Filed Vitals:   07/12/14 1900  BP: 174/92  Pulse:   Temp:   Resp:     Post-op Vital Signs: stable   Complications: No apparent anesthesia complications

## 2014-07-12 NOTE — Progress Notes (Signed)
Patient ID: [name], female   DOB: 25-Nov-1970, 44 y.o.   MRN: [id number] There has been no change in her cervix since this morning her contractions have been ineffective cervix is to type I and vertex floating she was delivered by C-section and also desires tubal ligation

## 2014-07-12 NOTE — Transfer of Care (Signed)
Immediate Anesthesia Transfer of Care Note  Patient: Jacqueline HarborCandice M Rowe  Procedure(s) Performed: Procedure(s): CESAREAN SECTION (N/A)  Patient Location: PACU  Anesthesia Type:Regional  Level of Consciousness: awake, alert  and oriented  Airway & Oxygen Therapy: Patient Spontanous Breathing  Post-op Assessment: Report given to RN and Post -op Vital signs reviewed and stable  Post vital signs: Reviewed and stable  Last Vitals:  Filed Vitals:   07/12/14 1433  BP: 136/76  Pulse: 189  Temp:   Resp:     Complications: No apparent anesthesia complications

## 2014-07-12 NOTE — Progress Notes (Signed)
Patient ID: [name], female   DOB: 05-Apr-1971, 44 y.o.   MRN: [id number] Latest blood pressure 145/86 Contractions irregular she is on 20 units of Pitocin that will be increased cervix is 1 cm the vertex still -3 station unable to interrupt her membranes will continue Pitocin until 3 PM today

## 2014-07-12 NOTE — Anesthesia Procedure Notes (Signed)
Epidural Patient location during procedure: OB  Staffing Anesthesiologist: Kelaiah Escalona Performed by: anesthesiologist   Preanesthetic Checklist Completed: patient identified, site marked, surgical consent, pre-op evaluation, timeout performed, IV checked, risks and benefits discussed and monitors and equipment checked  Epidural Patient position: sitting Prep: site prepped and draped and DuraPrep Patient monitoring: continuous pulse ox and blood pressure Approach: midline Location: L3-L4 Injection technique: LOR saline  Needle:  Needle type: Tuohy  Needle gauge: 17 G Needle length: 9 cm and 9 Needle insertion depth: 10 cm Catheter type: closed end flexible Catheter size: 19 Gauge Catheter at skin depth: 20 cm Test dose: negative  Assessment Events: blood not aspirated, injection not painful, no injection resistance, negative IV test and no paresthesia  Additional Notes Patient identified. Risks/Benefits/Options discussed with patient including but not limited to bleeding, infection, nerve damage, paralysis, failed block, incomplete pain control, headache, blood pressure changes, nausea, vomiting, reactions to medication both or allergic, itching and postpartum back pain. Confirmed with bedside nurse the patient's most recent platelet count. Confirmed with patient that they are not currently taking any anticoagulation, have any bleeding history or any family history of bleeding disorders. Patient expressed understanding and wished to proceed. All questions were answered. Sterile technique was used throughout the entire procedure. Please see nursing notes for vital signs. Test dose was given through epidural catheter and negative prior to continuing to dose epidural or start infusion. Warning signs of high block given to the patient including shortness of breath, tingling/numbness in hands, complete motor block, or any concerning symptoms with instructions to call for help. Patient was  given instructions on fall risk and not to get out of bed. All questions and concerns addressed with instructions to call with any issues or inadequate analgesia.   Spinal needle was advanced through tuohy and as it was pressed in as far as possible, CSF did slowly return. Initially unable to aspirate CSF then removed spinal needle and reconfirmed LOR with saline which was present. Spinal needle advanced again through tuohy and CSF return, clear, swirl seen in syringe and local and opioids injected.

## 2014-07-12 NOTE — Anesthesia Preprocedure Evaluation (Signed)
Anesthesia Evaluation  Patient identified by MRN, date of birth, ID band Patient awake    Reviewed: Allergy & Precautions, NPO status , Patient's Chart, lab work & pertinent test results  History of Anesthesia Complications Negative for: history of anesthetic complications  Airway Mallampati: III  TM Distance: >3 FB Neck ROM: Full    Dental no notable dental hx. (+) Dental Advisory Given   Pulmonary shortness of breath and with exertion, former smoker,  breath sounds clear to auscultation  Pulmonary exam normal       Cardiovascular hypertension (PreE v Chronic HTN), Pt. on medications negative cardio ROS  Rhythm:Regular Rate:Normal     Neuro/Psych  Headaches, negative psych ROS   GI/Hepatic negative GI ROS, Neg liver ROS,   Endo/Other  Morbid obesity  Renal/GU negative Renal ROS  negative genitourinary   Musculoskeletal negative musculoskeletal ROS (+)   Abdominal   Peds negative pediatric ROS (+)  Hematology negative hematology ROS (+)   Anesthesia Other Findings   Reproductive/Obstetrics negative OB ROS                             Anesthesia Physical Anesthesia Plan  ASA: IV  Anesthesia Plan: Epidural   Post-op Pain Management:    Induction:   Airway Management Planned:   Additional Equipment:   Intra-op Plan:   Post-operative Plan:   Informed Consent: I have reviewed the patients History and Physical, chart, labs and discussed the procedure including the risks, benefits and alternatives for the proposed anesthesia with the patient or authorized representative who has indicated his/her understanding and acceptance.   Dental advisory given  Plan Discussed with:   Anesthesia Plan Comments:         Anesthesia Quick Evaluation

## 2014-07-13 LAB — CBC
HCT: 31.8 % — ABNORMAL LOW (ref 36.0–46.0)
Hemoglobin: 10.4 g/dL — ABNORMAL LOW (ref 12.0–15.0)
MCH: 29.5 pg (ref 26.0–34.0)
MCHC: 32.7 g/dL (ref 30.0–36.0)
MCV: 90.3 fL (ref 78.0–100.0)
PLATELETS: 254 10*3/uL (ref 150–400)
RBC: 3.52 MIL/uL — ABNORMAL LOW (ref 3.87–5.11)
RDW: 15.2 % (ref 11.5–15.5)
WBC: 7.8 10*3/uL (ref 4.0–10.5)

## 2014-07-13 MED ORDER — RANITIDINE HCL 150 MG PO TABS
150.0000 mg | ORAL_TABLET | Freq: Two times a day (BID) | ORAL | Status: DC
  Administered 2014-07-13 – 2014-07-17 (×8): 150 mg via ORAL

## 2014-07-13 MED ORDER — FUROSEMIDE 10 MG/ML IJ SOLN
40.0000 mg | Freq: Once | INTRAMUSCULAR | Status: AC
  Administered 2014-07-13: 40 mg via INTRAVENOUS

## 2014-07-13 MED ORDER — RANITIDINE HCL 150 MG/10ML PO SYRP: 150.0000 mg | ORAL_SOLUTION | Freq: Two times a day (BID) | ORAL | Status: DC

## 2014-07-13 MED ORDER — HYDROMORPHONE HCL 1 MG/ML IJ SOLN: 1.0000 mg | INTRAMUSCULAR | Status: DC | PRN

## 2014-07-13 MED ORDER — LABETALOL HCL 200 MG PO TABS
200.0000 mg | ORAL_TABLET | Freq: Four times a day (QID) | ORAL | Status: DC
  Administered 2014-07-13 – 2014-07-15 (×8): 200 mg via ORAL
  Filled 2014-07-13 (×8): qty 1

## 2014-07-13 MED ORDER — RANITIDINE HCL 150 MG PO TABS: 150.0000 mg | ORAL_TABLET | Freq: Two times a day (BID) | ORAL | Status: DC

## 2014-07-13 NOTE — Anesthesia Postprocedure Evaluation (Signed)
Anesthesia Post Note  Patient: Jacqueline Rowe  Procedure(s) Performed: Procedure(s) (LRB): CESAREAN SECTION (N/A)  Anesthesia type: Spinal  Patient location: Mother/Baby  Post pain: Pain level controlled  Post assessment: Post-op Vital signs reviewed  Last Vitals:  Filed Vitals:   07/13/14 0810  BP: 131/69  Pulse: 81  Temp: 36.9 C  Resp: 22    Post vital signs: Reviewed  Level of consciousness: awake  Complications: No apparent anesthesia complications

## 2014-07-13 NOTE — Lactation Note (Signed)
This note was copied from the chart of Girl B[place]andice Mello. Lactation Consultation Note  Patient Name: Girl Julious PayerCandice Croslin ZOXWR'UToday's Date: 07/13/2014 Reason for consult: Follow-up assessment   With this mom of an early term baby,  Now 37 weeks CGA. Mom reports she is trying to breast feed prior to giving formula. I advised her to limit breast feeding to 15 minutes, and then offer formula every 3 hours. I also advised mom to increase amount offered to baby up to 20 mls with next feeding, and up to 30 in next 24 hours, etc. Mom agreed to this.    Maternal Data    Feeding Feeding Type: Breast Fed  LATCH Score/Interventions                      Lactation Tools Discussed/Used     Consult Status Consult Status: Follow-up Date: 07/14/14 Follow-up type: In-patient    Alfred LevinsLee, Bexton Haak Anne 07/13/2014, 3:58 PM

## 2014-07-13 NOTE — Addendum Note (Signed)
Addendum  created 07/13/14 0827 by Algis GreenhouseLinda A Celenia Hruska, CRNA   Modules edited: Notes Section   Notes Section:  File: [id number]

## 2014-07-13 NOTE — Progress Notes (Signed)
Patient ID: Jacqueline HarborCandice M Ruesch, female   DOB: 07-22-1970, 44 y.o.   MRN: [id number] Postop day 1 Blood pressure 136/72 pulse 86 respirations 18 total all put she is admitted 2800 cc Abdomen soft Incision clean and dry Legs 3+ edema She is still on her magnesium sulfate does of the discontinued at 4 PM today if her vital signs normal

## 2014-07-14 ENCOUNTER — Encounter (HOSPITAL_COMMUNITY): Payer: Self-pay | Admitting: Obstetrics

## 2014-07-14 NOTE — Progress Notes (Signed)
Called Dr Gaynell FaceMarshall regarding patient;s BP of 150/53. Dr. Gaynell FaceMarshall stated do not give IV BP medicine until 160/105 or greater.  Pt has no blurred vision, no headache, no reflexes, no clonus.

## 2014-07-14 NOTE — Progress Notes (Signed)
Patient ID: [name], female   DOB: 1971/03/08, 44 y.o.   MRN: [id number] Blood pressure 133/60 Pulse 80 respirations 20  Output  good abdominal wall still edematous dressing dry lochia moderate legs 3+  Edema  doing well

## 2014-07-14 NOTE — Lactation Note (Signed)
This note was copied from the chart of Jacqueline Rowe. Lactation Consultation Note  Patient Name: Jacqueline Rowe NWGNF'AToday's Date: 07/14/2014 Reason for consult: Follow-up assessment;Late preterm infant;Breast surgery;Difficult latch Mom reports baby is latching to right breast, but not to left breast. She reports pumping after feedings receiving few drops off/on. Hx of Breast reduction. Mom is supplementing 20 ml today of formula. Mom's left nipple has very short shaft almost flat with aerola edema making aerola tissue thick, non compressible. The right nipple has short shaft but compressible. Had Mom pre-pump for 2-3 minutes, left nipple improved slightly. Assisted Mom with latching baby to right breast. Mom not always obtaining good depth. Demonstrated breast compression to help with latch and after few attempts baby was able to sustain good depth with some swallowing motions observed. Baby nursed for 10 minutes on right breast. Tried #24 nipple shield on left breast and baby was able to latch. After few suckles was able to obtain good depth. Baby suckled off/on for 5 minutes then fell asleep. Drop of colostrum visible in nipple shield.  Plan discussed with Mom is to continue to work with latching baby at breast - use nipple shield if needed. Try to get baby to BF each breast for 15 minutes. Supplement with each feeding according to LPT guidelines, minimum of 20 ml today. Post pump for 15 minutes after each feeding.  If this plan becomes overwhelming - advised Mom she can pump/supplement some feedings but encouraged to offer breast as much as possible.  Encouraged to make OP f/u with d/c. Encouraged to call for assist with feedings.   Maternal Data    Feeding Feeding Type: Breast Fed Nipple Type: Slow - flow Length of feed: 15 min  LATCH Score/Interventions Latch: Repeated attempts needed to sustain latch, nipple held in mouth throughout feeding, stimulation needed to elicit  sucking reflex. (used #24 NS to latch on left breast, right no NS) Intervention(s): Adjust position;Assist with latch;Breast massage;Breast compression  Audible Swallowing: A few with stimulation (BF right breast, no swallows BF left breast)  Type of Nipple: Everted at rest and after stimulation (short shaft bilateral, left w/aerola edema - non-compressible) Intervention(s): Double electric pump  Comfort (Breast/Nipple): Soft / non-tender     Hold (Positioning): Assistance needed to correctly position infant at breast and maintain latch. Intervention(s): Breastfeeding basics reviewed;Support Pillows;Position options  LATCH Score: 7  Lactation Tools Discussed/Used Tools: Pump;Nipple Shields Nipple shield size: 24 Breast pump type: Double-Electric Breast Pump   Consult Status Consult Status: Follow-up Date: 07/15/14 Follow-up type: In-patient    Jacqueline LevinsGranger, Jacqueline Rowe 07/14/2014, 2:10 PM

## 2014-07-15 MED ORDER — FUROSEMIDE 40 MG PO TABS
40.0000 mg | ORAL_TABLET | Freq: Every day | ORAL | Status: DC
  Administered 2014-07-15 – 2014-07-17 (×3): 40 mg via ORAL
  Filled 2014-07-15 (×4): qty 1

## 2014-07-15 MED ORDER — HYDRALAZINE HCL 10 MG PO TABS
10.0000 mg | ORAL_TABLET | Freq: Once | ORAL | Status: AC
  Administered 2014-07-15: 10 mg via ORAL
  Filled 2014-07-15: qty 1

## 2014-07-15 MED ORDER — LABETALOL HCL 200 MG PO TABS
400.0000 mg | ORAL_TABLET | Freq: Four times a day (QID) | ORAL | Status: DC
  Administered 2014-07-15 – 2014-07-16 (×5): 400 mg via ORAL
  Filled 2014-07-15 (×5): qty 2

## 2014-07-15 NOTE — Lactation Note (Signed)
This note was copied from the chart of Jacqueline Cyprus. Lactation Consultation Note  Patient Name: Jacqueline Rowe [id number] Date: 07/15/2014  Baby 73 hours of life. Mom reports nursing going very well now. Mom is putting baby to breast first, supplementing with EBM/formula, and then post-pumping. Enc mom to continue to hand express after pumping as well. Mom has close to an ounce of EBM on counter for next supplementation. Offered to assist/see a latch, mom declined stating that she is fine. Reviewed LPI behavior. Mom aware of OP/BFSG and [place] phone line assistance after D/C. Enc mom to call her insurance company about DEBP, and mom states that she has access to a friend's pump that she never used. Discussed recommendation that personal pumps are single use, and enc mom to take her kit from hospital to use. Enc mom to call for assistance as needed.    Maternal Data    Feeding Feeding Type:  (Mom states she is going to feed in a little while, but she does not want any assistance.) Nipple Type: Slow - flow Length of feed: 0 min  LATCH Score/Interventions                      Lactation Tools Discussed/Used Pump Review: Setup, frequency, and cleaning   Consult Status Consult Status: Follow-up Date: 07/16/14 Follow-up type: In-patient    Inocente Salles 07/15/2014, 6:09 PM

## 2014-07-15 NOTE — Progress Notes (Signed)
Called Dr. Gaynell FaceMarshall d/t patient's BP of 183/83 with a pulse of 114 @ 1825.  Instructed to only call if Both numbers are above the 160/110 parameters.

## 2014-07-15 NOTE — Progress Notes (Signed)
Patient has pain and blood pressure of 192/103.  Labetalol given as well as Percocet.  No complaints of headache or blurred vision.  Edema in feet/ankles is +4.

## 2014-07-15 NOTE — Progress Notes (Signed)
Patient ID: Jacqueline HarborCandice M Rowe, female   DOB: [birth year], 44 y.o.   MRN: [id number] Postop day 3 blood pressure this morning 180/89 She received hydralazine 10 mg by mouth Dressing dry4  plus edema she was started on Lasix 40 mg by mouth daily Will not discharge today

## 2014-07-15 NOTE — Progress Notes (Signed)
Notified Dr. Gaynell FaceMarshall of BP 196/74 and received order for Lasix 40mg  QD and to call if BP 160/105.  Patient is asymptomatic.

## 2014-07-15 NOTE — Progress Notes (Signed)
Rechecked patient's BP after giving medication and BP was down to 157/78.  Instructed patient to continue to rest and relax.  Patient is still asymptomatic.

## 2014-07-15 NOTE — Progress Notes (Signed)
Notified Dr. Gaynell FaceMarshall of patient's BP of 188/86 and received order for one time dose of Hydralazine 10mg  po and Labetalol increase dose from 200mg  to 400mg  po. Patient is still asymptomatic.

## 2014-07-16 MED ORDER — HYDRALAZINE HCL 10 MG PO TABS
10.0000 mg | ORAL_TABLET | Freq: Three times a day (TID) | ORAL | Status: DC
  Administered 2014-07-16 (×2): 10 mg via ORAL
  Filled 2014-07-16 (×5): qty 1

## 2014-07-16 MED ORDER — CLONIDINE HCL 0.2 MG PO TABS
0.2000 mg | ORAL_TABLET | Freq: Two times a day (BID) | ORAL | Status: DC
  Administered 2014-07-16 – 2014-07-17 (×2): 0.2 mg via ORAL
  Filled 2014-07-16 (×4): qty 1

## 2014-07-16 MED ORDER — HYDRALAZINE HCL 25 MG PO TABS
25.0000 mg | ORAL_TABLET | Freq: Three times a day (TID) | ORAL | Status: DC
  Filled 2014-07-16 (×3): qty 1

## 2014-07-16 NOTE — Lactation Note (Signed)
This note was copied from the chart of Jacqueline Bermudaandice Kibble.  Lactation Consultation Note  Follow up visit at 584 days of age.  Mom reports doing breast, bottle and pumping on her terms as she feels like doing.  Mom reports she is giving EBM now and denies any questions or concerns and denies need for assist.  Encouraged mom to record or report all feedings in anticipation of discharge.  [facility] RN at bedside to check mom and reports seeing a good latch about 1 hour ago.  MOm to call for assist as needed.     Patient Name: Jacqueline Julious PayerCandice Spain WUJWJ'XToday's Date: 07/16/2014 Reason for consult: Follow-up assessment   Maternal Data    Feeding Feeding Type: Bottle Fed - Breast Milk Length of feed: 15 min  LATCH Score/Interventions                Intervention(s): Breastfeeding basics reviewed     Lactation Tools Discussed/Used     Consult Status Consult Status: Follow-up Date: 07/17/14 Follow-up type: In-patient    Jacqueline Rowe, Arvella MerlesJana Rowe 07/16/2014, 5:51 PM

## 2014-07-16 NOTE — Progress Notes (Signed)
Re-check of blood pressure, 185/96.  No complaints of headache or blurred vision.  4+ edema in feet/ankles.  Educated patient to elevate feet, drink plenty of water.

## 2014-07-16 NOTE — Progress Notes (Signed)
Patient ID: Jacqueline Rowe, female   DOB: Aug 28, 1970, 44 y.o.   MRN: [id number] Postop day 4  highest  blood pressure was 198  \103  and her last blood pressure was 185/96 she was  started on labetalol 400 every 6 hours yesterday and she received hydralazine by mouth 10 mg for blood pressures are 160  \  110 Today will put on hydralazine hydralazine 10 mg every 8 hours along with her labetalol and her Lasix which is 40 mg daily Her lungs are clear Heart regular rhythm no murmurs no gallops She's normoreflexic and she has no headache she has no abdominal pain she has no complaints of any kind so will continue the above listed medications

## 2014-07-16 NOTE — Progress Notes (Signed)
Notified Dr. Gaynell FaceMarshall of BP of 206/117 with pulse of 106.  New orders received to increase dose of hydralazine from 10mg  TID to 25mg  TID starting at 1800.  Pt with no clonus, headache, blurred vision, or dizziness.

## 2014-07-16 NOTE — Progress Notes (Signed)
Dr. Gaynell FaceMarshall called back with new orders to stop the labetalol and the hydralazine.  New orders received to start patient on clonidine 0.2mg  BID starting with first dose now. Pt with no other complaints.

## 2014-07-16 NOTE — Lactation Note (Signed)
This note was copied from the chart of Jacqueline Rowe. Lactation Consultation Note  Entered room and patient resting, she asked if LC could come back later. Belenda CruiseKristin RN passed on the information that the patient has been told not to breastfeed per her [place] MD due to her antihypertensive medication Hydralazine. Provided information to [facility] RN that medication is an L2 and according to [place] [facility] Mother's Milk is probably compatible with breastfeeding. Provided RN with mediation information sheet to discuss with MD and patient.  Lactation will follow up later today.  Patient Name: Jacqueline Julious PayerCandice Borges ZOXWR'UToday's Date: 07/16/2014     Maternal Data    Feeding    LATCH Score/Interventions                      Lactation Tools Discussed/Used     Consult Status      Dahlia ByesBerkelhammer, Ruth [facility] 07/16/2014, 9:01 AM

## 2014-07-17 NOTE — Progress Notes (Signed)
Dr Gaynell FaceMarshall notified of pt bp before and a hour after bp medication. Vital signs noted. No new orders given. Pt has no complaints at this time. Will continue to monitor pt.

## 2014-07-17 NOTE — Discharge Instructions (Signed)
Discharge instructions ° °· You can wash your hair °· Shower °· Eat what you want °· Drink what you want °· See me in 6 weeks °· Your ankles are going to swell more in the next 2 weeks than when pregnant °· No sex for 6 weeks ° ° °Jacqueline Rowe A, MD 07/17/2014 ° ° °

## 2014-07-17 NOTE — Progress Notes (Signed)
Patient ID: Jacqueline HarborCandice M Rowe, female   DOB: 08-27-1970, 44 y.o.   MRN: [id number] Postop day 5 Blood pressure 177/84 pulse 90 The patient was started on clonidine 0.2 twice a day last night because she was on labetalol 400 4 times a day hydralazine 25 mg 3 times a day without good control since the clonidine has been started her blood pressures have been much better than before although not normal she will check her blood pressures at home and then see me on Thursday for blood pressure check so she'll be in touch with me her staples were removed and the incision is clean and dry patient feels fine no headache no abdominal pain and will be discharged today to see me on Thursday for blood pressure check she will also be discharged on Lasix 20 mg by mouth daily and Fioricet for pain her abdominal wall is still edematous and she has 3+ pedal edema

## 2014-07-17 NOTE — Discharge Summary (Signed)
Obstetric Discharge Summary Reason for Admission: induction of labor Prenatal Procedures: NST Intrapartum Procedures: cesarean: low cervical, transverse Postpartum Procedures: none Complications-Operative and Postpartum: none HEMOGLOBIN  Date Value Ref Range Status  07/13/2014 10.4* 12.0 - 15.0 g/dL Final   HCT  Date Value Ref Range Status  07/13/2014 31.8* 36.0 - 46.0 % Final    Physical Exam:  General: alert Lochia: appropriate Uterine Fundus: firm Incision: healing well DVT Evaluation: No evidence of DVT seen on physical exam.  Discharge Diagnoses: Term Pregnancy-delivered  Discharge Information: Date: 07/17/2014 Activity: pelvic rest Diet: routine Medications: Percocet Condition: improved Instructions: refer to practice specific booklet Discharge to: home Follow-up Information    Follow up with [name],Jacqueline Ennis A, MD.   Specialty:  Obstetrics and Gynecology   Contact information:   [address] [place] [address] [phone]       Newborn Data: Live born female  Birth Weight: 7 lb 1.8 oz (3225 g) APGAR: 8, 9  Home with mother.  Jacqueline Rowe 07/17/2014, 11:49 AM

## 2014-07-17 NOTE — Progress Notes (Signed)
Spoke with Dr. Gaynell FaceMarshall in person and he stated to tell day shift nurse to give morning blood pressure medication and to check blood pressure 1 hour after blood pressure medication given and to notify him of blood pressure as soon as it has been taking. Reported message to assigned dayshift RN

## 2014-07-20 ENCOUNTER — Inpatient Hospital Stay (HOSPITAL_COMMUNITY)
Admission: AD | Admit: 2014-07-20 | Discharge: 2014-07-25 | DRG: 776 | Disposition: A | Discharge status: Home / Self Care | Payer: Commercial Managed Care - PPO | Source: Ambulatory Visit | Attending: Obstetrics | Admitting: Obstetrics

## 2014-07-20 ENCOUNTER — Encounter (HOSPITAL_COMMUNITY): Payer: Self-pay

## 2014-07-20 DIAGNOSIS — O99215 Obesity complicating the puerperium: Secondary | ICD-10-CM | POA: Diagnosis present

## 2014-07-20 DIAGNOSIS — Z87891 Personal history of nicotine dependence: Secondary | ICD-10-CM

## 2014-07-20 DIAGNOSIS — I158 Other secondary hypertension: Secondary | ICD-10-CM | POA: Diagnosis present

## 2014-07-20 DIAGNOSIS — Z6841 Body Mass Index (BMI) 40.0 and over, adult: Secondary | ICD-10-CM | POA: Diagnosis not present

## 2014-07-20 DIAGNOSIS — O1495 Unspecified pre-eclampsia, complicating the puerperium: Secondary | ICD-10-CM | POA: Diagnosis present

## 2014-07-20 DIAGNOSIS — O9089 Other complications of the puerperium, not elsewhere classified: Principal | ICD-10-CM | POA: Diagnosis present

## 2014-07-20 DIAGNOSIS — O1490 Unspecified pre-eclampsia, unspecified trimester: Secondary | ICD-10-CM

## 2014-07-20 DIAGNOSIS — R030 Elevated blood-pressure reading, without diagnosis of hypertension: Secondary | ICD-10-CM | POA: Diagnosis present

## 2014-07-20 HISTORY — DX: Gestational (pregnancy-induced) hypertension without significant proteinuria, unspecified trimester: O13.9

## 2014-07-20 LAB — CBC
HEMATOCRIT: 33.7 % — AB (ref 36.0–46.0)
HEMOGLOBIN: 11 g/dL — AB (ref 12.0–15.0)
MCH: 29.1 pg (ref 26.0–34.0)
MCHC: 32.6 g/dL (ref 30.0–36.0)
MCV: 89.2 fL (ref 78.0–100.0)
Platelets: 438 10*3/uL — ABNORMAL HIGH (ref 150–400)
RBC: 3.78 MIL/uL — ABNORMAL LOW (ref 3.87–5.11)
RDW: 14 % (ref 11.5–15.5)
WBC: 7.2 10*3/uL (ref 4.0–10.5)

## 2014-07-20 LAB — URINALYSIS, ROUTINE W REFLEX MICROSCOPIC
BILIRUBIN URINE: NEGATIVE
Glucose, UA: NEGATIVE mg/dL
Ketones, ur: NEGATIVE mg/dL
Leukocytes, UA: NEGATIVE
Nitrite: NEGATIVE
PH: 6 (ref 5.0–8.0)
PROTEIN: NEGATIVE mg/dL
UROBILINOGEN UA: 0.2 mg/dL (ref 0.0–1.0)

## 2014-07-20 LAB — COMPREHENSIVE METABOLIC PANEL
ALK PHOS: 84 U/L (ref 39–117)
ALT: 38 U/L — ABNORMAL HIGH (ref 0–35)
AST: 38 U/L — AB (ref 0–37)
Albumin: 2.8 g/dL — ABNORMAL LOW (ref 3.5–5.2)
Anion gap: 6 (ref 5–15)
BILIRUBIN TOTAL: 0.2 mg/dL — AB (ref 0.3–1.2)
BUN: 10 mg/dL (ref 6–23)
CO2: 30 mmol/L (ref 19–32)
Calcium: 8.7 mg/dL (ref 8.4–10.5)
Chloride: 105 mmol/L (ref 96–112)
Creatinine, Ser: 0.88 mg/dL (ref 0.50–1.10)
GFR calc non Af Amer: 81 mL/min — ABNORMAL LOW (ref >90)
Glucose, Bld: 104 mg/dL — ABNORMAL HIGH (ref 70–99)
Potassium: 4.3 mmol/L (ref 3.5–5.1)
Sodium: 141 mmol/L (ref 135–145)
Total Protein: 6.5 g/dL (ref 6.0–8.3)

## 2014-07-20 LAB — URINE MICROSCOPIC-ADD ON

## 2014-07-20 LAB — PROTEIN / CREATININE RATIO, URINE
CREATININE, URINE: 59 mg/dL
Protein Creatinine Ratio: 0.24 — ABNORMAL HIGH (ref 0.00–0.15)
TOTAL PROTEIN, URINE: 14 mg/dL

## 2014-07-20 LAB — LACTATE DEHYDROGENASE: LDH: 269 U/L — AB (ref 94–250)

## 2014-07-20 LAB — URIC ACID: URIC ACID, SERUM: 4.9 mg/dL (ref 2.4–7.0)

## 2014-07-20 LAB — TYPE AND SCREEN
ABO/RH(D): A POS
Antibody Screen: NEGATIVE

## 2014-07-20 MED ORDER — LABETALOL HCL 5 MG/ML IV SOLN
40.0000 mg | Freq: Once | INTRAVENOUS | Status: AC
  Administered 2014-07-20: 40 mg via INTRAVENOUS
  Filled 2014-07-20: qty 8

## 2014-07-20 MED ORDER — LABETALOL HCL 5 MG/ML IV SOLN
20.0000 mg | Freq: Once | INTRAVENOUS | Status: AC
  Administered 2014-07-20: 20 mg via INTRAVENOUS
  Filled 2014-07-20: qty 4

## 2014-07-20 MED ORDER — LACTATED RINGERS IV SOLN
INTRAVENOUS | Status: DC
  Administered 2014-07-20 (×3): via INTRAVENOUS

## 2014-07-20 MED ORDER — MAGNESIUM SULFATE 40 G IN LACTATED RINGERS - SIMPLE
2.0000 g/h | INTRAVENOUS | Status: DC
Start: 2014-07-20 — End: 2014-07-21
  Administered 2014-07-20 (×2): 2 g/h via INTRAVENOUS
  Filled 2014-07-20 (×2): qty 500

## 2014-07-20 MED ORDER — LABETALOL HCL 200 MG PO TABS
200.0000 mg | ORAL_TABLET | Freq: Three times a day (TID) | ORAL | Status: DC
Start: 2014-07-20 — End: 2014-07-20
  Administered 2014-07-20 (×3): 200 mg via ORAL
  Filled 2014-07-20 (×5): qty 1

## 2014-07-20 MED ORDER — LABETALOL HCL 200 MG PO TABS
200.0000 mg | ORAL_TABLET | Freq: Three times a day (TID) | ORAL | Status: DC
  Administered 2014-07-20: 200 mg via ORAL
  Filled 2014-07-20 (×2): qty 1

## 2014-07-20 MED ORDER — MAGNESIUM SULFATE BOLUS VIA INFUSION
4.0000 g | Freq: Once | INTRAVENOUS | Status: AC
  Administered 2014-07-20: 4 g via INTRAVENOUS
  Filled 2014-07-20: qty 500

## 2014-07-20 MED ORDER — HYDRALAZINE HCL 20 MG/ML IJ SOLN
5.0000 mg | INTRAMUSCULAR | Status: DC | PRN
  Administered 2014-07-20 (×3): via INTRAVENOUS
  Administered 2014-07-20 – 2014-07-24 (×26): 5 mg via INTRAVENOUS
  Filled 2014-07-20 (×21): qty 1

## 2014-07-20 MED ORDER — GUAIFENESIN 100 MG/5ML PO SYRP
100.0000 mg | ORAL_SOLUTION | ORAL | Status: DC | PRN
  Administered 2014-07-21: 300 mg via ORAL
  Administered 2014-07-23 – 2014-07-24 (×3): 100 mg via ORAL
  Filled 2014-07-20 (×6): qty 5

## 2014-07-20 MED ORDER — FUROSEMIDE 10 MG/ML IJ SOLN
20.0000 mg | Freq: Once | INTRAMUSCULAR | Status: AC
  Administered 2014-07-20: 20 mg via INTRAVENOUS
  Filled 2014-07-20: qty 2

## 2014-07-20 MED ORDER — BUTALBITAL-APAP-CAFFEINE 50-325-40 MG PO TABS
2.0000 | ORAL_TABLET | Freq: Four times a day (QID) | ORAL | Status: DC | PRN
  Administered 2014-07-20 – 2014-07-24 (×10): 2 via ORAL
  Filled 2014-07-20 (×8): qty 2
  Filled 2014-07-20 (×2): qty 1
  Filled 2014-07-20: qty 2

## 2014-07-20 MED ORDER — FUROSEMIDE 40 MG PO TABS
40.0000 mg | ORAL_TABLET | Freq: Every day | ORAL | Status: DC
  Administered 2014-07-20 – 2014-07-21 (×2): 40 mg via ORAL
  Filled 2014-07-20 (×3): qty 1

## 2014-07-20 MED ORDER — HYDRALAZINE HCL 20 MG/ML IJ SOLN
10.0000 mg | Freq: Once | INTRAMUSCULAR | Status: AC
  Administered 2014-07-20: 10 mg via INTRAVENOUS
  Filled 2014-07-20: qty 1

## 2014-07-20 MED ORDER — CLONIDINE HCL 0.3 MG PO TABS
0.3000 mg | ORAL_TABLET | Freq: Two times a day (BID) | ORAL | Status: DC
  Administered 2014-07-20 (×2): 0.3 mg via ORAL
  Filled 2014-07-20 (×5): qty 1

## 2014-07-20 NOTE — MAU Provider Note (Signed)
History     CSN: [id number]  Arrival date and time: 07/20/14 0101   First Provider Initiated Contact with Patient 07/20/14 [phone]      Chief Complaint  Patient presents with  . Hypertension   HPI    [name] is a 44 y.o. female [phone] post partum; status post cesarean section on 3/19 presents with elevated BP readings. She went to [facility] this evening after suffering from a headache most of today. When she checked her pressure it read 207/129; she called Dr. Jodi Rowe.  Her pregnancy was complicated with chronic hypertension, morbid obesity and shortness of breath.  She was followed by MFM because of chronic hypertension and initially her BP was controlled with high dose labetalol.   She was on Magnesium infusion post partum; she was sent home with clonidine and has been taking that as prescribed.    OB History    Gravida Para Term Preterm AB TAB SAB Ectopic Multiple Living   '4 1 0 1 3 0 0 0 0 1 '$      Past Medical History  Diagnosis Date  . Hypertension   . Pregnancy induced hypertension     Past Surgical History  Procedure Laterality Date  . Breast surgery    . Cesarean section N/A 07/12/2014    Procedure: CESAREAN SECTION;  Surgeon: Jacqueline Hamman, MD;  Location: [facility];  Service: Obstetrics;  Laterality: N/A;    Family History  Problem Relation Age of Onset  . Migraines Neg Hx   . High blood pressure Mother   . High blood pressure Maternal Aunt   . High blood pressure Sister     History  Substance Use Topics  . Smoking status: Former Research scientist (life sciences)  . Smokeless tobacco: Not on file  . Alcohol Use: Yes     Comment: before pregnant    Allergies: No Known Allergies  Prescriptions prior to admission  Medication Sig Dispense Refill Last Dose  . cloNIDine (CATAPRES) 0.3 MG tablet Take 0.3 mg by mouth 2 (two) times daily.   07/19/2014 at 1800  . furosemide (LASIX) 40 MG tablet Take 40 mg by mouth daily.   07/19/2014 at 1500  . guaiFENesin (MUCINEX) 600  MG 12 hr tablet Take 600 mg by mouth 2 (two) times daily.   07/19/2014 at 1300   Results for orders placed or performed during the hospital encounter of 07/20/14 (from the past 48 hour(s))  Protein / creatinine ratio, urine     Status: Abnormal   Collection Time: 07/20/14  1:33 AM  Result Value Ref Range   Creatinine, Urine 59.00 mg/dL   Total Protein, Urine 14 mg/dL    Comment: NO NORMAL RANGE ESTABLISHED FOR THIS TEST   Protein Creatinine Ratio 0.24 (H) 0.00 - 0.15  Urinalysis, Routine w reflex microscopic     Status: Abnormal   Collection Time: 07/20/14  1:33 AM  Result Value Ref Range   Color, Urine YELLOW YELLOW   APPearance CLEAR CLEAR   Specific Gravity, Urine <1.005 (L) 1.005 - 1.030   pH 6.0 5.0 - 8.0   Glucose, UA NEGATIVE NEGATIVE mg/dL   Hgb urine dipstick SMALL (A) NEGATIVE   Bilirubin Urine NEGATIVE NEGATIVE   Ketones, ur NEGATIVE NEGATIVE mg/dL   Protein, ur NEGATIVE NEGATIVE mg/dL   Urobilinogen, UA 0.2 0.0 - 1.0 mg/dL   Nitrite NEGATIVE NEGATIVE   Leukocytes, UA NEGATIVE NEGATIVE  Urine microscopic-add on     Status: None   Collection Time: 07/20/14  1:33  AM  Result Value Ref Range   Squamous Epithelial / LPF RARE RARE   WBC, UA 0-2 <3 WBC/hpf   RBC / HPF 0-2 <3 RBC/hpf   Bacteria, UA RARE RARE  CBC     Status: Abnormal   Collection Time: 07/20/14  1:40 AM  Result Value Ref Range   WBC 7.2 4.0 - 10.5 K/uL   RBC 3.78 (L) 3.87 - 5.11 MIL/uL   Hemoglobin 11.0 (L) 12.0 - 15.0 g/dL   HCT 33.7 (L) 36.0 - 46.0 %   MCV 89.2 78.0 - 100.0 fL   MCH 29.1 26.0 - 34.0 pg   MCHC 32.6 30.0 - 36.0 g/dL   RDW 14.0 11.5 - 15.5 %   Platelets 438 (H) 150 - 400 K/uL  Comprehensive metabolic panel     Status: Abnormal   Collection Time: 07/20/14  1:40 AM  Result Value Ref Range   Sodium 141 135 - 145 mmol/L   Potassium 4.3 3.5 - 5.1 mmol/L   Chloride 105 96 - 112 mmol/L   CO2 30 19 - 32 mmol/L   Glucose, Bld 104 (H) 70 - 99 mg/dL   BUN 10 6 - 23 mg/dL   Creatinine, Ser  0.88 0.50 - 1.10 mg/dL   Calcium 8.7 8.4 - 10.5 mg/dL   Total Protein 6.5 6.0 - 8.3 g/dL   Albumin 2.8 (L) 3.5 - 5.2 g/dL   AST 38 (H) 0 - 37 U/L   ALT 38 (H) 0 - 35 U/L   Alkaline Phosphatase 84 39 - 117 U/L   Total Bilirubin 0.2 (L) 0.3 - 1.2 mg/dL   GFR calc non Af Amer 81 (L) >90 mL/min   GFR calc Af Amer >90 >90 mL/min    Comment: (NOTE) The eGFR has been calculated using the CKD EPI equation. This calculation has not been validated in all clinical situations. eGFR's persistently <90 mL/min signify possible Chronic Kidney Disease.    Anion gap 6 5 - 15  Lactate dehydrogenase     Status: Abnormal   Collection Time: 07/20/14  1:40 AM  Result Value Ref Range   LDH 269 (H) 94 - 250 U/L  Uric acid     Status: None   Collection Time: 07/20/14  1:40 AM  Result Value Ref Range   Uric Acid, Serum 4.9 2.4 - 7.0 mg/dL    Review of Systems  Eyes: Negative for blurred vision.  Respiratory: Positive for shortness of breath.   Cardiovascular: Positive for chest pain.  Gastrointestinal: Negative for abdominal pain.  Neurological: Positive for headaches (Rates her HA pain 7/10).   Physical Exam   Blood pressure 165/95, pulse 89, resp. rate 36, height 5' 6.5" (1.689 m), weight 138.256 kg (304 lb 12.8 oz), last menstrual period 10/27/2013, SpO2 97 %, currently breastfeeding.  Physical Exam  Constitutional: She is oriented to person, place, and time. She appears well-developed and well-nourished.  Cardiovascular: Normal rate and normal heart sounds.   Respiratory: Effort normal and breath sounds normal. No respiratory distress.  GI: Soft. There is no tenderness.  Musculoskeletal: She exhibits edema (3+ pitting edema in bilateral lower extremities).  Neurological: She is alert and oriented to person, place, and time. She has normal reflexes.  Negative clonus   Skin: Skin is warm.  Psychiatric: Her speech is normal and behavior is normal. Her mood appears anxious.    MAU Course   Procedures  None  MDM  Cath UA  0135 Discussed patient with Dr. Ardelle Rowe protocol  and admission to ICU for magnesium infusion  20 mg of labetalol IV in MAU times one dose. 20 mg of Lasix IV in MAU    Assessment and Plan   1. Preeclampsia in postpartum period, unspecified trimester    P:  Admit to ICU per Dr. Jodi Rowe Magnesium bolus Magnesium infusion  Lezlie Lye, NP 07/20/2014 1:41 AM

## 2014-07-20 NOTE — Progress Notes (Signed)
   07/20/14 2210  Vitals  BP (!) 155/84 mmHg  MAP (mmHg) 98  BP Location Left Arm  BP Method Automatic  Patient Position (if appropriate) Sitting  ECG Heart Rate 100  Resp (!) 26  F/U B/P post Hydralizine

## 2014-07-20 NOTE — Progress Notes (Signed)
   07/20/14 0800  Vitals  BP (!) 176/95 mmHg  MAP (mmHg) 115  Pulse Rate 71  ECG Heart Rate 78  Resp (!) 29  Oxygen Therapy  SpO2 95 %  40mg  IV labetalol given, as previously ordered.

## 2014-07-20 NOTE — Progress Notes (Signed)
   07/20/14 1500  Vitals  BP (!) 174/91 mmHg  MAP (mmHg) 112  ECG Heart Rate 91  Resp (!) 29  5mg  Hydralazine given IV

## 2014-07-20 NOTE — Progress Notes (Signed)
   07/20/14 2200  Vitals  BP (!) 161/96 mmHg  MAP (mmHg) 107  BP Location Left Arm  BP Method Automatic  Patient Position (if appropriate) Sitting  Pulse Rate 95  ECG Heart Rate 95  Resp 20  PCA/Epidural/Spinal Assessment  Respiratory Pattern Regular;Unlabored;Symmetrical  Oxygen Therapy  SpO2 99 %  Hydralazine 5 mg IV administered as orderes. We will recheck B/P in 10 minutes.

## 2014-07-20 NOTE — Progress Notes (Signed)
Post Partum Day 8 Subjective: Has headache  Objective: Blood pressure 174/91, pulse 92, temperature 98.6 F (37 C), temperature source Oral, resp. rate 29, height 5' 5.5" (1.664 m), weight 297 lb 6.4 oz (134.9 kg), last menstrual period 10/27/2013, SpO2 99 %, currently breastfeeding.  Physical Exam:  General: alert and no distress Lochia: appropriate Uterine Fundus: firm Incision: healing well DVT Evaluation: No evidence of DVT seen on physical exam.   Recent Labs  07/20/14 0140  HGB 11.0*  HCT 33.7*    Assessment/Plan: Postpartum Hypertension.  H/O chronic Hypertension. Stable.  Continue treatment of BP.   LOS: 0 days   HARPER,CHARLES A 07/20/2014, 3:38 PM

## 2014-07-20 NOTE — Progress Notes (Signed)
   07/20/14 1400  Vitals  BP (!) 167/81 mmHg  MAP (mmHg) 99  ECG Heart Rate 91  Resp 19  5mg  Hydralazine IV given
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
## 2014-07-20 NOTE — MAU Note (Signed)
[facility] b/c her head was hurting bp was 209/126. Headache since yesterday. Denies vision changes. Denies epigastric pain. Chest pain & SOB since yesterday.

## 2014-07-20 NOTE — H&P (Signed)
History     CSN: [id number]  Arrival date and time: 07/20/14 0101   First Provider Initiated Contact with Patient 07/20/14 [phone]      Chief Complaint  Patient presents with  . Hypertension   HPI    [name] is a 44 y.o. female [phone] post partum; status post cesarean section on 3/19 presents with elevated BP readings. She went to [facility] this evening after suffering from a headache most of today. When she checked her pressure it read 207/129. Her pregnancy was complicated with chronic hypertension, morbid obesity and shortness of breath.  She was followed by MFM because of chronic hypertension and initially her BP was controlled with high dose labetalol.   She was on Magnesium infusion post partum; she was sent home with clonidine and has been taking that as prescribed.    OB History    Gravida Para Term Preterm AB TAB SAB Ectopic Multiple Living   '4 1 0 1 3 0 0 0 0 1 '$      Past Medical History  Diagnosis Date  . Hypertension   . Pregnancy induced hypertension     Past Surgical History  Procedure Laterality Date  . Breast surgery    . Cesarean section N/A 07/12/2014    Procedure: CESAREAN SECTION;  Surgeon: Frederico Hamman, MD;  Location: [facility];  Service: Obstetrics;  Laterality: N/A;    Family History  Problem Relation Age of Onset  . Migraines Neg Hx   . High blood pressure Mother   . High blood pressure Maternal Aunt   . High blood pressure Sister     History  Substance Use Topics  . Smoking status: Former Research scientist (life sciences)  . Smokeless tobacco: Not on file  . Alcohol Use: Yes     Comment: before pregnant    Allergies: No Known Allergies  Prescriptions prior to admission  Medication Sig Dispense Refill Last Dose  . cloNIDine (CATAPRES) 0.3 MG tablet Take 0.3 mg by mouth 2 (two) times daily.   07/19/2014 at 1800  . furosemide (LASIX) 40 MG tablet Take 40 mg by mouth daily.   07/19/2014 at 1500  . guaiFENesin (MUCINEX) 600 MG 12 hr tablet Take 600 mg  by mouth 2 (two) times daily.   07/19/2014 at 1300   Results for orders placed or performed during the hospital encounter of 07/20/14 (from the past 48 hour(s))  Protein / creatinine ratio, urine     Status: Abnormal   Collection Time: 07/20/14  1:33 AM  Result Value Ref Range   Creatinine, Urine 59.00 mg/dL   Total Protein, Urine 14 mg/dL    Comment: NO NORMAL RANGE ESTABLISHED FOR THIS TEST   Protein Creatinine Ratio 0.24 (H) 0.00 - 0.15  Urinalysis, Routine w reflex microscopic     Status: Abnormal   Collection Time: 07/20/14  1:33 AM  Result Value Ref Range   Color, Urine YELLOW YELLOW   APPearance CLEAR CLEAR   Specific Gravity, Urine <1.005 (L) 1.005 - 1.030   pH 6.0 5.0 - 8.0   Glucose, UA NEGATIVE NEGATIVE mg/dL   Hgb urine dipstick SMALL (A) NEGATIVE   Bilirubin Urine NEGATIVE NEGATIVE   Ketones, ur NEGATIVE NEGATIVE mg/dL   Protein, ur NEGATIVE NEGATIVE mg/dL   Urobilinogen, UA 0.2 0.0 - 1.0 mg/dL   Nitrite NEGATIVE NEGATIVE   Leukocytes, UA NEGATIVE NEGATIVE  Urine microscopic-add on     Status: None   Collection Time: 07/20/14  1:33 AM  Result Value Ref  Range   Squamous Epithelial / LPF RARE RARE   WBC, UA 0-2 <3 WBC/hpf   RBC / HPF 0-2 <3 RBC/hpf   Bacteria, UA RARE RARE  CBC     Status: Abnormal   Collection Time: 07/20/14  1:40 AM  Result Value Ref Range   WBC 7.2 4.0 - 10.5 K/uL   RBC 3.78 (L) 3.87 - 5.11 MIL/uL   Hemoglobin 11.0 (L) 12.0 - 15.0 g/dL   HCT 33.7 (L) 36.0 - 46.0 %   MCV 89.2 78.0 - 100.0 fL   MCH 29.1 26.0 - 34.0 pg   MCHC 32.6 30.0 - 36.0 g/dL   RDW 14.0 11.5 - 15.5 %   Platelets 438 (H) 150 - 400 K/uL  Comprehensive metabolic panel     Status: Abnormal   Collection Time: 07/20/14  1:40 AM  Result Value Ref Range   Sodium 141 135 - 145 mmol/L   Potassium 4.3 3.5 - 5.1 mmol/L   Chloride 105 96 - 112 mmol/L   CO2 30 19 - 32 mmol/L   Glucose, Bld 104 (H) 70 - 99 mg/dL   BUN 10 6 - 23 mg/dL   Creatinine, Ser 0.88 0.50 - 1.10 mg/dL    Calcium 8.7 8.4 - 10.5 mg/dL   Total Protein 6.5 6.0 - 8.3 g/dL   Albumin 2.8 (L) 3.5 - 5.2 g/dL   AST 38 (H) 0 - 37 U/L   ALT 38 (H) 0 - 35 U/L   Alkaline Phosphatase 84 39 - 117 U/L   Total Bilirubin 0.2 (L) 0.3 - 1.2 mg/dL   GFR calc non Af Amer 81 (L) >90 mL/min   GFR calc Af Amer >90 >90 mL/min    Comment: (NOTE) The eGFR has been calculated using the CKD EPI equation. This calculation has not been validated in all clinical situations. eGFR's persistently <90 mL/min signify possible Chronic Kidney Disease.    Anion gap 6 5 - 15  Lactate dehydrogenase     Status: Abnormal   Collection Time: 07/20/14  1:40 AM  Result Value Ref Range   LDH 269 (H) 94 - 250 U/L  Uric acid     Status: None   Collection Time: 07/20/14  1:40 AM  Result Value Ref Range   Uric Acid, Serum 4.9 2.4 - 7.0 mg/dL    Review of Systems  Eyes: Negative for blurred vision.  Respiratory: Positive for shortness of breath.   Cardiovascular: Positive for chest pain.  Gastrointestinal: Negative for abdominal pain.  Neurological: Positive for headaches (Rates her HA pain 7/10).   Physical Exam   Blood pressure 165/95, pulse 89, resp. rate 36, height 5' 6.5" (1.689 m), weight 138.256 kg (304 lb 12.8 oz), last menstrual period 10/27/2013, SpO2 97 %, currently breastfeeding.  Physical Exam  Constitutional: She is oriented to person, place, and time. She appears well-developed and well-nourished.  Cardiovascular: Normal rate and normal heart sounds.   Respiratory: Effort normal and breath sounds normal. No respiratory distress.  GI: Soft. There is no tenderness.  Musculoskeletal: She exhibits edema (3+ pitting edema in bilateral lower extremities).  Neurological: She is alert and oriented to person, place, and time. She has normal reflexes.  Negative clonus   Skin: Skin is warm.  Psychiatric: Her speech is normal and behavior is normal. Her mood appears anxious.    MAU Course  Procedures  None  MDM  Cath UA   20 mg of labetalol IV in MAU times one dose. 20 mg of Lasix IV in MAU    Assessment and Plan   1. Preeclampsia in postpartum period, unspecified trimester    P:  Admit to ICU  Magnesium bolus Magnesium infusion  Baltazar Najjar MD

## 2014-07-21 MED ORDER — CLONIDINE HCL 0.1 MG PO TABS
0.1000 mg | ORAL_TABLET | Freq: Two times a day (BID) | ORAL | Status: DC
  Administered 2014-07-21 – 2014-07-22 (×3): 0.1 mg via ORAL
  Filled 2014-07-21 (×4): qty 1

## 2014-07-21 MED ORDER — SPIRONOLACTONE 25 MG PO TABS
25.0000 mg | ORAL_TABLET | Freq: Every day | ORAL | Status: DC
  Administered 2014-07-21 – 2014-07-24 (×4): 25 mg via ORAL
  Filled 2014-07-21 (×6): qty 1

## 2014-07-21 MED ORDER — LABETALOL HCL 200 MG PO TABS
400.0000 mg | ORAL_TABLET | Freq: Three times a day (TID) | ORAL | Status: DC
  Administered 2014-07-21 – 2014-07-22 (×5): 400 mg via ORAL
  Filled 2014-07-21 (×7): qty 2

## 2014-07-21 NOTE — Progress Notes (Signed)
   07/21/14 0000  Vitals  BP (!) 161/85 mmHg  MAP (mmHg) 104  BP Location Left Arm  BP Method Automatic  Patient Position (if appropriate) Sitting  Pulse Rate 90  ECG Heart Rate 90  Resp 20  PCA/Epidural/Spinal Assessment  Respiratory Pattern Regular;Unlabored;Symmetrical  Oxygen Therapy  SpO2 100 %  O2 Device Room Air  Hydealizine 5 mg IV administered as ordered. We will recheck in 10 minutes

## 2014-07-21 NOTE — Progress Notes (Signed)
UR chart review completed.  

## 2014-07-21 NOTE — Progress Notes (Signed)
   07/21/14 0041  Vitals  BP (!) 148/82 mmHg  MAP (mmHg) 97  BP Location Left Arm  BP Method Automatic  Patient Position (if appropriate) Lying  Pulse Rate 90  Pulse Rate Source Monitor  ECG Heart Rate 92  Resp 19  Oxygen Therapy  SpO2 97 %  B/P post Hydralazine 5 mg IV

## 2014-07-21 NOTE — Consult Note (Signed)
Mom in AICU with severe hypertension, readmitted after delivery and discharge to home. Mom has been receiving large doses of lasix , and antihypertensive medication. She has been breast and bottle, supplementing with formula, due to low milk supply. Mom give EBm first. She was losing a manual pump, so I set her up with a DEP, and decreased her to 21 flanges, with a good fit. Mom knows to call for questions/concerns.

## 2014-07-21 NOTE — Progress Notes (Signed)
   07/21/14 0200  Vitals  BP (!) 166/92 mmHg  MAP (mmHg) 106  BP Location Left Arm  BP Method Automatic  Patient Position (if appropriate) Lying  ECG Heart Rate 88  Resp (!) 28  Hydralazine 5 mg IV administered as ordered. We will recheck B/P in  10 minutes

## 2014-07-21 NOTE — Plan of Care (Signed)
Problem: Phase I Progression Outcomes Goal: Voiding-avoid urinary catheter unless indicated Outcome: Not Met (add Reason) Foley Cath insitu

## 2014-07-21 NOTE — Progress Notes (Signed)
Discussed POC with Dr. Gaynell FaceMarshall. Continuing to hold Clonidine for now since BP is 140's /60's. Will continue to monitor and await Cardiology consult.

## 2014-07-21 NOTE — Consult Note (Signed)
Reason for Consult: Labile hypertension Referring Physician: Dr. Dorina Hoyer Jacqueline Rowe is an 44 y.o. female.  HPI: Patient is 44 year old female with past medical history significant for hypertension, morbid obesity, GERD, status post C-section on 07/12/2014 was readmitted on 07/20/2014 because of markedly elevated blood pressure associated with headaches. Patient denies any chest pain nausea vomiting diaphoresis. Denies any palpitations denies any palpitations. Denies PND orthopnea but complains of leg swelling. Patient was treated with labetalol and hydralazine when necessary and clonidine which was increased to 0.3 mg twice daily and Lasix with improvement in her blood pressure and marked improvement in leg swelling. Today all day her blood pressure has been fairly well controlled and monitored in dose has been held since morning and a pressure is gradually creeping up in 170s over 70. Patient states she was taking labetalol and clonidine with good blood pressure control as outpatient prior to C-section. Patient presently denies any complaints.  Past Medical History  Diagnosis Date  . Hypertension   . Pregnancy induced hypertension     Past Surgical History  Procedure Laterality Date  . Breast surgery    . Cesarean section N/A 07/12/2014    Procedure: CESAREAN SECTION;  Surgeon: Frederico Hamman, MD;  Location: [facility];  Service: Obstetrics;  Laterality: N/A;    Family History  Problem Relation Age of Onset  . Migraines Neg Hx   . High blood pressure Mother   . High blood pressure Maternal Aunt   . High blood pressure Sister     Social History:  reports that she has quit smoking. She does not have any smokeless tobacco history on file. She reports that she drinks alcohol. She reports that she does not use illicit drugs.  Allergies: No Known Allergies  Medications: I have reviewed the patient's current medications.  Results for orders placed or performed during the  hospital encounter of 07/20/14 (from the past 48 hour(s))  Protein / creatinine ratio, urine     Status: Abnormal   Collection Time: 07/20/14  1:33 AM  Result Value Ref Range   Creatinine, Urine 59.00 mg/dL   Total Protein, Urine 14 mg/dL    Comment: NO NORMAL RANGE ESTABLISHED FOR THIS TEST   Protein Creatinine Ratio 0.24 (H) 0.00 - 0.15  Urinalysis, Routine w reflex microscopic     Status: Abnormal   Collection Time: 07/20/14  1:33 AM  Result Value Ref Range   Color, Urine YELLOW YELLOW   APPearance CLEAR CLEAR   Specific Gravity, Urine <1.005 (L) 1.005 - 1.030   pH 6.0 5.0 - 8.0   Glucose, UA NEGATIVE NEGATIVE mg/dL   Hgb urine dipstick SMALL (A) NEGATIVE   Bilirubin Urine NEGATIVE NEGATIVE   Ketones, ur NEGATIVE NEGATIVE mg/dL   Protein, ur NEGATIVE NEGATIVE mg/dL   Urobilinogen, UA 0.2 0.0 - 1.0 mg/dL   Nitrite NEGATIVE NEGATIVE   Leukocytes, UA NEGATIVE NEGATIVE  Urine microscopic-add on     Status: None   Collection Time: 07/20/14  1:33 AM  Result Value Ref Range   Squamous Epithelial / LPF RARE RARE   WBC, UA 0-2 <3 WBC/hpf   RBC / HPF 0-2 <3 RBC/hpf   Bacteria, UA RARE RARE  CBC     Status: Abnormal   Collection Time: 07/20/14  1:40 AM  Result Value Ref Range   WBC 7.2 4.0 - 10.5 K/uL   RBC 3.78 (L) 3.87 - 5.11 MIL/uL   Hemoglobin 11.0 (L) 12.0 - 15.0 g/dL   HCT  33.7 (L) 36.0 - 46.0 %   MCV 89.2 78.0 - 100.0 fL   MCH 29.1 26.0 - 34.0 pg   MCHC 32.6 30.0 - 36.0 g/dL   RDW 14.0 11.5 - 15.5 %   Platelets 438 (H) 150 - 400 K/uL  Comprehensive metabolic panel     Status: Abnormal   Collection Time: 07/20/14  1:40 AM  Result Value Ref Range   Sodium 141 135 - 145 mmol/L   Potassium 4.3 3.5 - 5.1 mmol/L   Chloride 105 96 - 112 mmol/L   CO2 30 19 - 32 mmol/L   Glucose, Bld 104 (H) 70 - 99 mg/dL   BUN 10 6 - 23 mg/dL   Creatinine, Ser 0.88 0.50 - 1.10 mg/dL   Calcium 8.7 8.4 - 10.5 mg/dL   Total Protein 6.5 6.0 - 8.3 g/dL   Albumin 2.8 (L) 3.5 - 5.2 g/dL   AST  38 (H) 0 - 37 U/L   ALT 38 (H) 0 - 35 U/L   Alkaline Phosphatase 84 39 - 117 U/L   Total Bilirubin 0.2 (L) 0.3 - 1.2 mg/dL   GFR calc non Af Amer 81 (L) >90 mL/min   GFR calc Af Amer >90 >90 mL/min    Comment: (NOTE) The eGFR has been calculated using the CKD EPI equation. This calculation has not been validated in all clinical situations. eGFR's persistently <90 mL/min signify possible Chronic Kidney Disease.    Anion gap 6 5 - 15  Lactate dehydrogenase     Status: Abnormal   Collection Time: 07/20/14  1:40 AM  Result Value Ref Range   LDH 269 (H) 94 - 250 U/L  Uric acid     Status: None   Collection Time: 07/20/14  1:40 AM  Result Value Ref Range   Uric Acid, Serum 4.9 2.4 - 7.0 mg/dL  Type and screen for Red Blood Exchange     Status: None   Collection Time: 07/20/14  1:40 AM  Result Value Ref Range   ABO/RH(D) A POS    Antibody Screen NEG    Sample Expiration 07/23/2014     No results found.  Review of Systems  Constitutional: Negative for fever and chills.  Eyes: Negative for double vision and photophobia.  Respiratory: Negative for cough, hemoptysis and sputum production.   Cardiovascular: Positive for leg swelling. Negative for chest pain, palpitations and orthopnea.  Gastrointestinal: Negative for nausea, vomiting and abdominal pain.  Genitourinary: Negative for dysuria.  Neurological: Positive for headaches. Negative for dizziness.   Blood pressure 180/83, pulse 96, temperature 97.7 F (36.5 C), temperature source Oral, resp. rate 19, height 5' 5.5" (1.664 m), weight 137.032 kg (302 lb 1.6 oz), last menstrual period 10/27/2013, SpO2 100 %, currently breastfeeding. Physical Exam  HENT:  Head: Normocephalic and atraumatic.  Eyes: Conjunctivae are normal. Pupils are equal, round, and reactive to light. Left eye exhibits no discharge. No scleral icterus.  Neck: Normal range of motion. Neck supple. No JVD present. No tracheal deviation present. No thyromegaly  present.  Cardiovascular: Normal rate, regular rhythm and normal heart sounds.  Exam reveals no gallop and no friction rub.   No murmur heard. Respiratory: Effort normal and breath sounds normal. No respiratory distress. She has no wheezes. She has no rales.  GI: Soft. Bowel sounds are normal. She exhibits no distension. There is no tenderness.  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 if blood pressure remains stable I'll follow up as outpatient in 2 weeks Thank you.  Clent Demark 07/21/2014, 5:49 PM

## 2014-07-21 NOTE — Progress Notes (Signed)
Patient ID: Jacqueline HarborCandice M Louk, female   DOB: 1970-10-17, 44 y.o.   MRN: [id number] Blood pressure this morning is 152/81 on admission was 182/101 When she delivered 9 days ago her weight was 326 pounds she is now down to 302 she is on clonidine 0.3 twice a day and her labetalol was increased this morning to 400 every 8 hours and she is on Lasix 40 mg by mouth daily she has no complaints her lungs are clear abdominal wall edema is significantly decreased and her pedal edema 3+ she is still on magnesium sulfate at this time and will be seen by Dr.  Sharyn Lullharwani  in consult today

## 2014-07-22 MED ORDER — LOSARTAN POTASSIUM 50 MG PO TABS
50.0000 mg | ORAL_TABLET | Freq: Every day | ORAL | Status: DC
  Administered 2014-07-22 – 2014-07-24 (×3): 50 mg via ORAL
  Filled 2014-07-22 (×4): qty 1

## 2014-07-22 MED ORDER — CARVEDILOL 12.5 MG PO TABS
12.5000 mg | ORAL_TABLET | Freq: Two times a day (BID) | ORAL | Status: DC
  Administered 2014-07-22 – 2014-07-24 (×4): 12.5 mg via ORAL
  Filled 2014-07-22 (×7): qty 1

## 2014-07-22 NOTE — Progress Notes (Signed)
Subjective:  Patient denies any chest pain or shortness of breath. 2-D echo done showed mildly depressed LV systolic function EF approximately 45-50%. Doubt significant peripartum cardiomyopathy. We will add angiotensin receptor blocker in view of mildly depressed LV systolic function and switch beta blocker to carvedilol.  Objective:  Vital Signs in the last 24 hours: Temp:  [98.2 F (36.8 C)-99.1 F (37.3 C)] 98.8 F (37.1 C) (03/29 1608) Pulse Rate:  [87-103] 96 (03/29 1714) Resp:  [16-47] 24 (03/29 1714) BP: (153-176)/(61-96) 166/82 mmHg (03/29 1714) SpO2:  [92 %-100 %] 98 % (03/29 1714) Weight:  [137.44 kg (303 lb)] 137.44 kg (303 lb) (03/29 0530)  Intake/Output from previous day: 03/28 0701 - 03/29 0700 In: 1970 [P.O.:1720; I.V.:250] Out: 2650 [Urine:2650] Intake/Output from this shift: Total I/O In: 680 [P.O.:680] Out: 800 [Urine:800]  Physical Exam: Neck: no adenopathy, no carotid bruit, no JVD and supple, symmetrical, trachea midline Lungs: clear to auscultation bilaterally Heart: regular rate and rhythm, S1, S2 normal, no murmur, click, rub or gallop Abdomen: soft, non-tender; bowel sounds normal; no masses,  no organomegaly Extremities: No clubbing cyanosis 1+ edema noted  Lab Results:  Recent Labs  07/20/14 0140  WBC 7.2  HGB 11.0*  PLT 438*    Recent Labs  07/20/14 0140  NA 141  K 4.3  CL 105  CO2 30  GLUCOSE 104*  BUN 10  CREATININE 0.88   No results for input(s): TROPONINI in the last 72 hours.  Invalid input(s): CK, MB Hepatic Function Panel  Recent Labs  07/20/14 0140  PROT 6.5  ALBUMIN 2.8*  AST 38*  ALT 38*  ALKPHOS 84  BILITOT 0.2*   No results for input(s): CHOL in the last 72 hours. No results for input(s): PROTIME in the last 72 hours.  Imaging: Imaging results have been reviewed and No results found.  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 Continue Aldactone. Okay to DC telemetry Follow-up with me in 2 weeks  LOS: 2 days    Ary Lavine N 07/22/2014, 5:17 PM

## 2014-07-22 NOTE — Progress Notes (Signed)
 5mg  Hydralazine given

## 2014-07-22 NOTE — Progress Notes (Signed)
  Echocardiogram 2D Echocardiogram has been performed.  Janalyn HarderWest, Chales Pelissier R 07/22/2014, 1:03 PM

## 2014-07-22 NOTE — Progress Notes (Addendum)
   07/22/14 1450  Vitals  BP (!) 170/90 mmHg  MAP (mmHg) 110  ECG Heart Rate 91  Resp (!) 34   PRN Hydralazine 5 mg given 1454

## 2014-07-22 NOTE — Progress Notes (Signed)
Spoke with DR. Harwani.  Advised to continue Telemetry monitoring.  Will follow-up after Echo results. Nursing will continue to monitor.

## 2014-07-22 NOTE — Progress Notes (Signed)
Patient ID: Jacqueline HarborCandice M Rowe, female   DOB: [birth year], 44 y.o.   MRN: [id number] Blood pressure 165/96 Patient feels fine she has no headache no abdominal pain chest pain She was seen in consult by the cardiologist Dr. Autumn MessingHowe on yesterday and he decreased her clonidine 2.1 twice a day and increased the labetalol to 400 every 8 hours and she is still on her Lasix 40 mg Her abdominal wall is less edematous and she still has 3+ pedal edema Dr. Autumn MessingHowe on a clear her from a cardiology standpoint and the patient will be observed for at least another 24 hours

## 2014-07-22 NOTE — Progress Notes (Signed)
   07/22/14 2248  Vitals  BP (!) 174/80 mmHg  MAP (mmHg) 104  Pulse Rate 88  Resp (!) 23  An additional 5mg  IV Hydralazine given as ordered.

## 2014-07-23 ENCOUNTER — Encounter (HOSPITAL_COMMUNITY): Payer: Self-pay | Admitting: *Deleted

## 2014-07-23 MED ORDER — CLONIDINE HCL 0.2 MG PO TABS
0.2000 mg | ORAL_TABLET | Freq: Two times a day (BID) | ORAL | Status: DC
  Administered 2014-07-23 – 2014-07-24 (×4): 0.2 mg via ORAL
  Filled 2014-07-23 (×5): qty 1

## 2014-07-23 MED ORDER — CLONIDINE HCL 0.1 MG PO TABS
0.1000 mg | ORAL_TABLET | Freq: Once | ORAL | Status: AC
  Administered 2014-07-23: 0.1 mg via ORAL
  Filled 2014-07-23: qty 1

## 2014-07-23 MED ORDER — ACETAMINOPHEN 325 MG PO TABS
650.0000 mg | ORAL_TABLET | ORAL | Status: DC | PRN
  Administered 2014-07-23 – 2014-07-25 (×4): 650 mg via ORAL
  Filled 2014-07-23 (×4): qty 2

## 2014-07-23 NOTE — Progress Notes (Signed)
Pt sleeping with baby on chest - baby safety reinforced again.  Pt states "she is fine" and refused to place baby back in crib.  Pillows positioned around infant in bed.  Husband aware of conflict.

## 2014-07-23 NOTE — Progress Notes (Signed)
Pt fell asleep while RN still at bedside. Pt refused to put baby in bassinet. All 4 side rails put up by pt with pillows propped on either side of pt

## 2014-07-23 NOTE — Progress Notes (Signed)
Patient with elevated blood pressure, 3 doses apresoline 5mg  IV given, blood pressure remains elevated. Patient stated "Do not give me any more of that medicine, its not working, I don't want it." Dr Gaynell FaceMarshall was notified. He asked this RN to talk with patient, tell her "She knows why she is here and she needs to let you give the medicine." After speaking again with the patient she agreed to allow med to be given. Will continue to monitor.

## 2014-07-23 NOTE — Progress Notes (Signed)
BP at 1545 was 170/81, 5 mg of Hydralazine per order at 1554. Repeat BP was 165/ 81 at 1605, 5 mg of Hydralazine given per order 1614. Repeat BP was 159/99 at 1625. Will continue to monitor pt.

## 2014-07-23 NOTE — Progress Notes (Addendum)
10:00 Spoke with pharmacist, Arline Aspindy, earlier this AM about pt medications & breastfeeding. She planned to look them up & call MD. MD called with instructions to remind pt that she was not to breast on current medications. MD states that he told pt not to breastfeed during last admission when she was put on clonidine for her BP's. Pt became aggitated & crying stating that no one told her not to breastfeed. Called lactation to talk with pt, & MD to come in to speak with her. Pt calm & laughing while MD in her room. But continued to be somewhat angry & hostile with RN on reassesment at 12:30. According to other staff, pt was bottle feeding on readmission to AICU. AC & AD notified of situation.

## 2014-07-23 NOTE — Progress Notes (Signed)
Pt transferred ambulatory, pleasant & calm now. Arnold LongEmily Caldwell, RN updated.

## 2014-07-23 NOTE — Progress Notes (Signed)
Patient ID: Jacqueline HarborCandice M Annett, female   DOB: October 03, 1970, 44 y.o.   MRN: [id number] Blood pressure this morning 1 5477 highest last night 178/92 she now weighs 301 pounds Dr.  Sharyn Lullharwani  discontinued her labetalol and Lasix and she is now on Aldactone 25 mg daily Cozaar 50 mg daily and Catapres 0.2 twice a day and Coreg 12.5 twice a day As usual she has no headache no pain feels fine Her pedal edema is 2+ Continue present therapy

## 2014-07-23 NOTE — Progress Notes (Signed)
Updated Dr. Gaynell FaceMarshall about pt's blood pressures, systolic in the 170s despite 10mg  of hydralazine given. New orders to increase clonidine to 0.2mg  twice daily. Give an additional 0.1mg  now.   Henderson NewcomerStephanie Mayda Shippee, RN 07/23/2014 12:19 AM

## 2014-07-24 MED ORDER — FUROSEMIDE 10 MG/ML IJ SOLN
40.0000 mg | Freq: Once | INTRAMUSCULAR | Status: AC
  Administered 2014-07-24: 40 mg via INTRAVENOUS
  Filled 2014-07-24: qty 4

## 2014-07-24 MED ORDER — CARVEDILOL 25 MG PO TABS
25.0000 mg | ORAL_TABLET | Freq: Two times a day (BID) | ORAL | Status: DC
  Administered 2014-07-24: 25 mg via ORAL
  Filled 2014-07-24 (×2): qty 1

## 2014-07-24 MED ORDER — LOSARTAN POTASSIUM 50 MG PO TABS
100.0000 mg | ORAL_TABLET | Freq: Every day | ORAL | Status: DC
  Filled 2014-07-24: qty 2

## 2014-07-24 NOTE — Progress Notes (Signed)
Subjective:  Patient denies any chest pain or shortness of breath. Denies any headaches or palpitations. Blood pressure still remains labile  Objective:  Vital Signs in the last 24 hours: Temp:  [98.1 F (36.7 C)-98.9 F (37.2 C)] 98.9 F (37.2 C) (03/31 1400) Pulse Rate:  [79-111] 98 (03/31 1449) Resp:  [20-24] 24 (03/31 1400) BP: (145-187)/(59-97) 159/95 mmHg (03/31 1449) SpO2:  [96 %-98 %] 96 % (03/31 1400) Weight:  [134.265 kg (296 lb)] 134.265 kg (296 lb) (03/31 0600)  Intake/Output from previous day: 03/30 0701 - 03/31 0700 In: 1880 [P.O.:1880] Out: 5225 [Urine:5225] Intake/Output from this shift: Total I/O In: 1990 [P.O.:1990] Out: 2000 [Urine:2000]  Physical Exam: Neck: no adenopathy, no carotid bruit, no JVD and supple, symmetrical, trachea midline Lungs: clear to auscultation bilaterally Heart: regular rate and rhythm, S1, S2 normal, no murmur, click, rub or gallop Abdomen: soft, non-tender; bowel sounds normal; no masses,  no organomegaly Extremities: No clubbing cyanosis 1+ edema noted  Lab Results: No results for input(s): WBC, HGB, PLT in the last 72 hours. No results for input(s): NA, K, CL, CO2, GLUCOSE, BUN, CREATININE in the last 72 hours. No results for input(s): TROPONINI in the last 72 hours.  Invalid input(s): CK, MB Hepatic Function Panel No results for input(s): PROT, ALBUMIN, AST, ALT, ALKPHOS, BILITOT, BILIDIR, IBILI in the last 72 hours. No results for input(s): CHOL in the last 72 hours. No results for input(s): PROTIME in the last 72 hours.  Imaging: Imaging results have been reviewed and No results found.  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

## 2014-07-24 NOTE — Progress Notes (Signed)
Pt is asleep in room with baby in bed with her. Woke patient and offered to move baby to bassinet. Pt declined. Sheryn BisonGordon, Laurna Shetley Warner

## 2014-07-24 NOTE — Progress Notes (Signed)
Patient ID: [name], female   DOB: Oct 16, 1970, 44 y.o.   MRN: [id number] Blood pressure range   145\68-187/95 on her new regimen which she is on for the past 36 hours Patient is frustrated and wants to go  Home  explained  We  still have to get her blood pressures  Under control  Yesterday she was very upset about breast-feeding she said sh e spoke to  nurses about the fact that she had been started on large regimen of medication and  They said  was okay for her to breast-feed I reminded patient that on day 4 after her C-section when she was started on clonidine I told her that she should stop breast-feeding because of the medication patient says she did not remember that conversation   not breast-feeding and will discuss blood pressures with Dr.  Sharyn Lullharwani  today

## 2014-07-25 NOTE — Progress Notes (Signed)
Discharge instructions given to patient. Dr Gaynell FaceMarshall has given her scripts. Understanding voiced. Patient to notify nursing when ready to leave.

## 2014-07-25 NOTE — Progress Notes (Signed)
Patient ready for discharge. Escorted with husband and all belongings, infant in car seat, to private auto by Okey Regalarol NT.

## 2014-07-25 NOTE — Progress Notes (Signed)
Patient ID: [name], female   DOB: 06-02-1970, 44 y.o.   MRN: [id number] Blood pressure 156/75 respirations 20 pulse 95 and temperature 98.8 Output good Discussed with Dr.  Jerrell Belfastharwanihe states that patient can be discharged today to follow up with him in a week and she has a hypertensive heart disease on exam her incision is clean and dry the fluid is resolving in the abdominal wall and her pedal edema is 2+ assessment the patient has no symptoms she feels fine no headache no chest pain abdominal pain and she can be discharged today to see Dr. Roselee Nova1 in one week on Aldactone 25 mg daily Cozaar 100 mg twice a day daily Catapres 0.2 twice a day and Coreg 25 mg twice a day

## 2014-07-25 NOTE — Discharge Instructions (Signed)
Discharge instructions   You can wash your hair  Shower  Eat what you want  Drink what you want  See me in 6 weeks  Your ankles are going to swell more in the next 2 weeks than when pregnant  No sex for 6 weeks   Staisha Winiarski A, MD 07/25/2014

## 2014-07-25 NOTE — Discharge Summary (Signed)
  Patient is a 44 year old female who had a C-section on 07/11/2013 induction and chronic hypertension and was discharged on 325 she was readmitted on 326 with a headache and blood pressure 200/118 she was on clonidine 0.2 twice a day and Lasix 40 mg on admission she was started on magnesium sulfate and continued on her clonidine she was seen by Dr.  Sharyn Lullharwani  cardiology and she had a cardiac workup which was negative and  He believed she had chronic hypertensive disease she was started on Aldactone 25 mg daily Cozaar has been increased to 100 mg daily Catapres 0.2 twice a day and Coreg 25 mg twice a day he states that she can be discharged today and follow up with him in a week her incision is clean and dry and her edema is no 2+ patient has no complaints

## 2014-10-23 ENCOUNTER — Other Ambulatory Visit: Payer: Self-pay | Admitting: Nurse Practitioner

## 2014-10-23 DIAGNOSIS — Z1231 Encounter for screening mammogram for malignant neoplasm of breast: Secondary | ICD-10-CM

## 2015-05-20 IMAGING — US US OB LIMITED
1 series · 14 of 24 positions shown · non-contrast
Comparison: none

[Series 1: us ob limited · 0.23mm/px · 24 acquisitions, 14 frames shown]
[im 1/24]
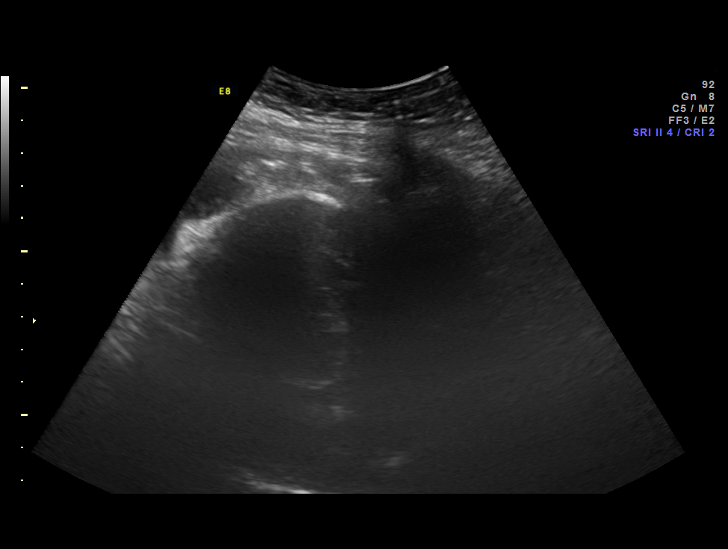
[im 3/24]
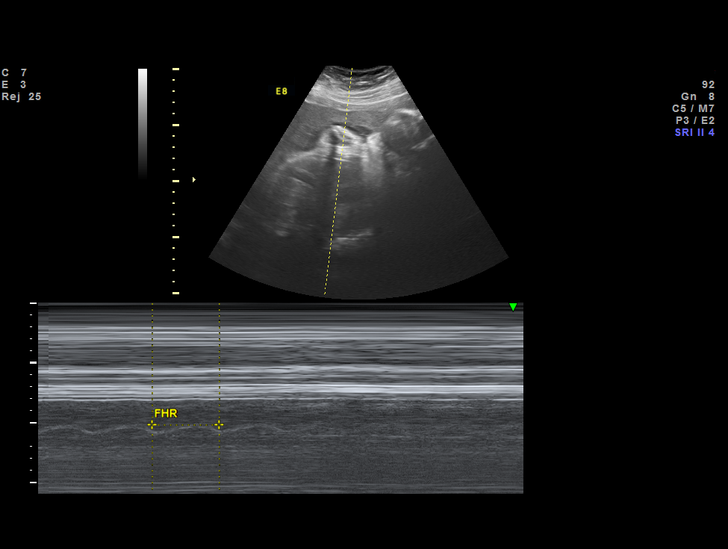
[im 5/24]
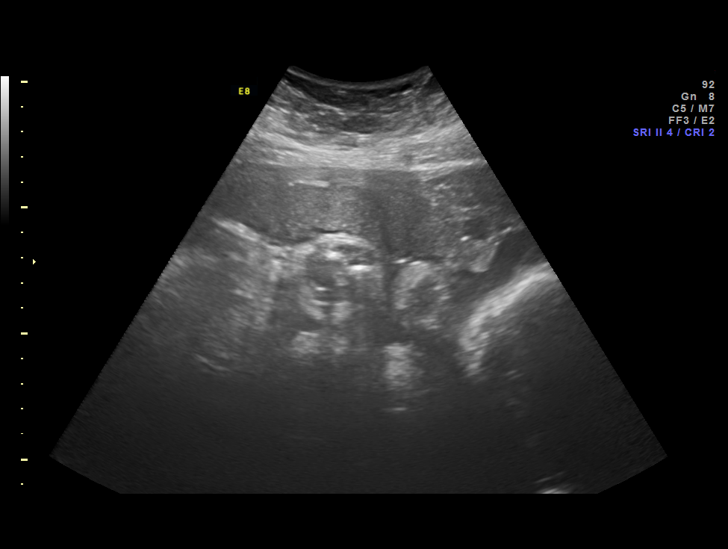
[im 7/24]
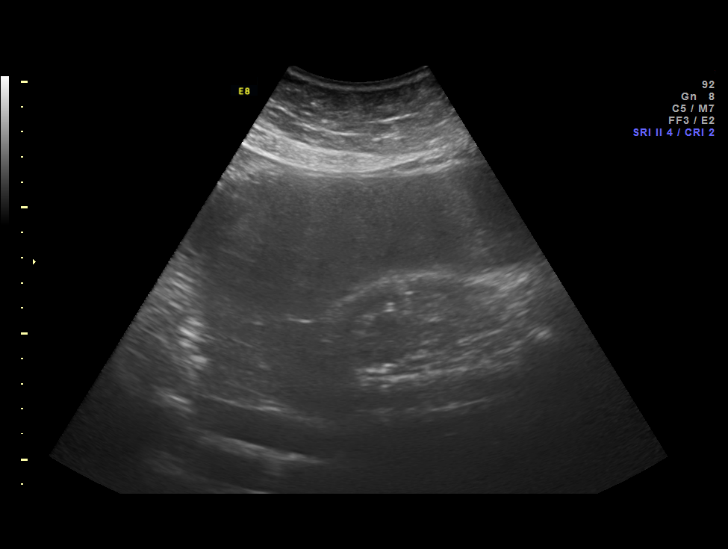
[im 8/24]
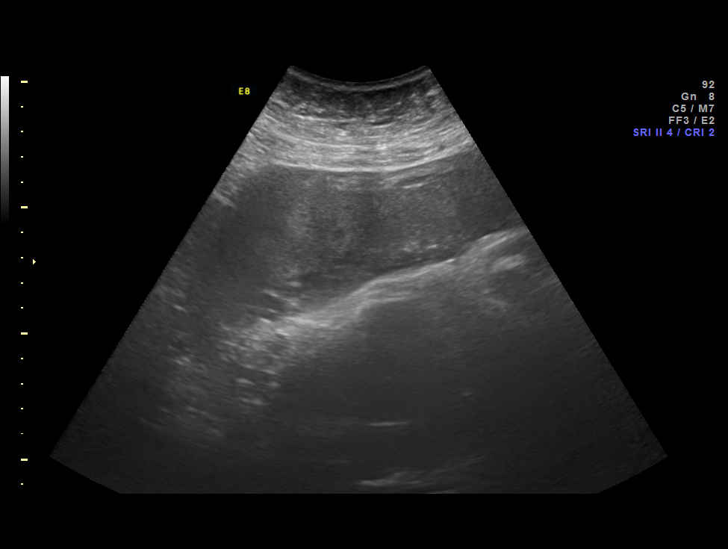
[im 10/24]
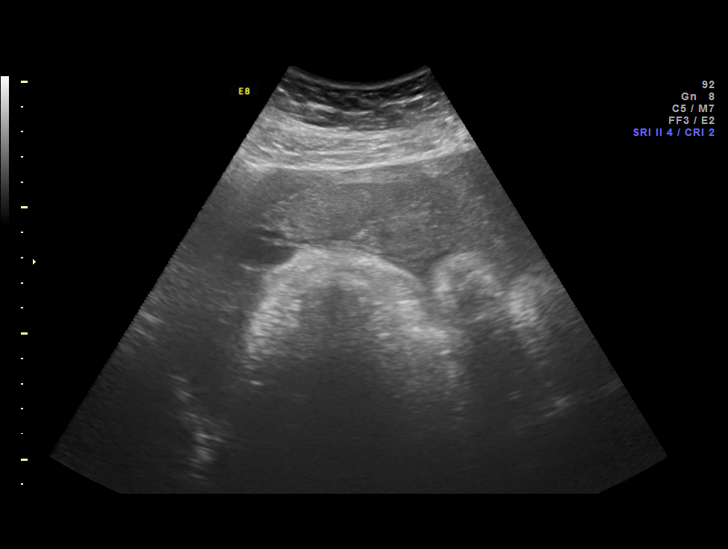
[im 12/24]
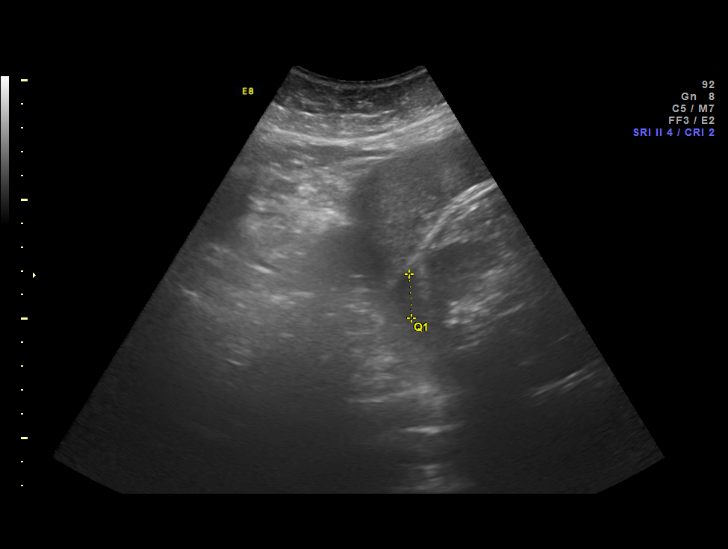
[im 13/24]
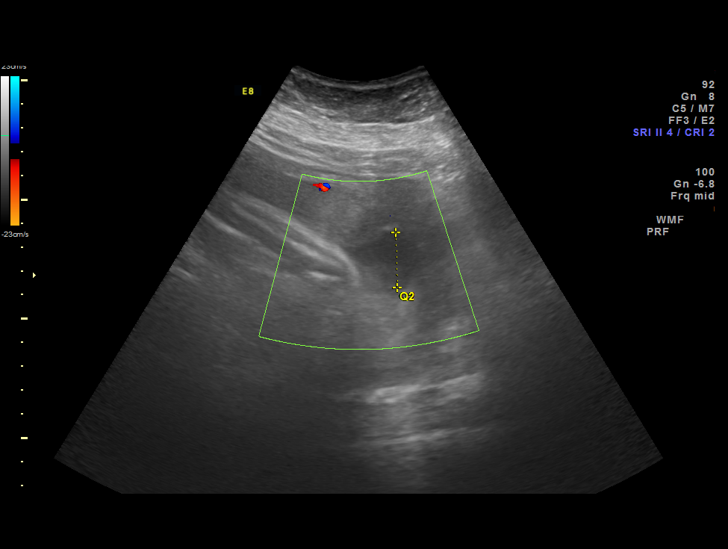
[im 15/24]
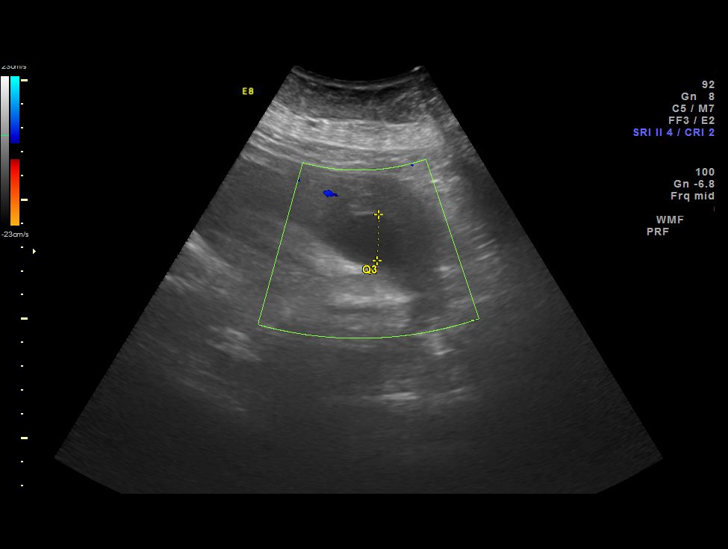
[im 17/24]
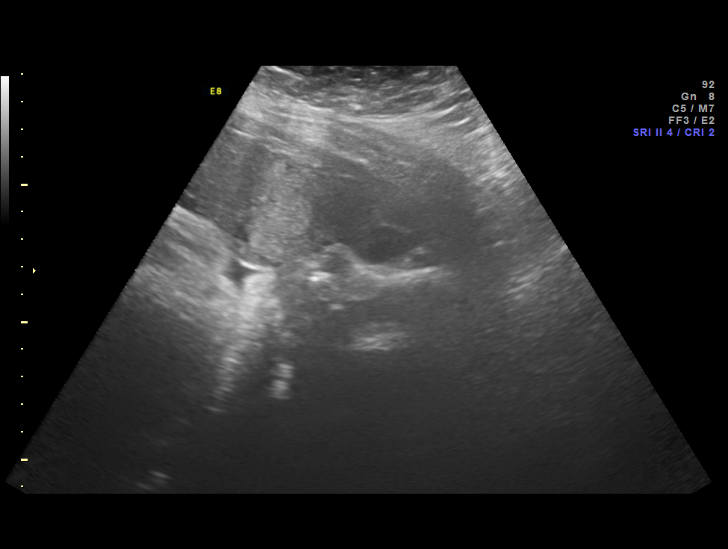
[im 19/24]
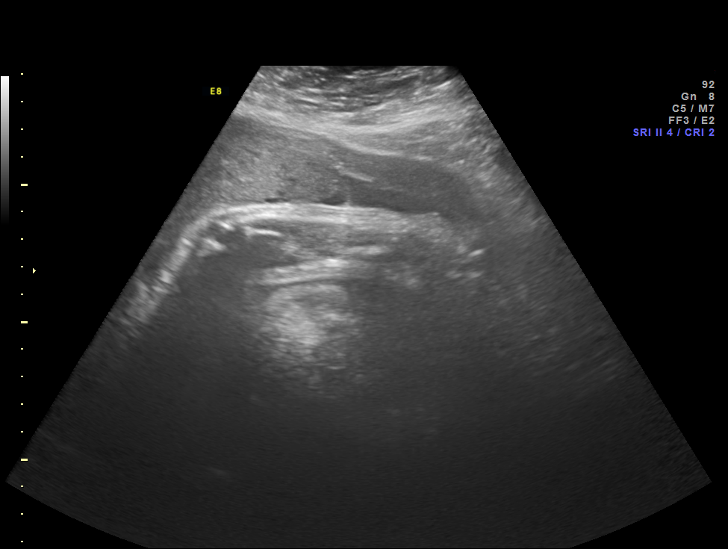
[im 20/24]
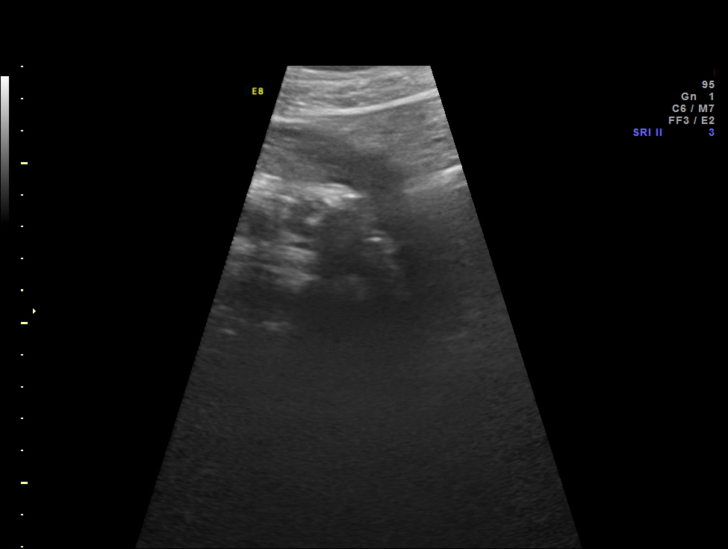
[im 22/24]
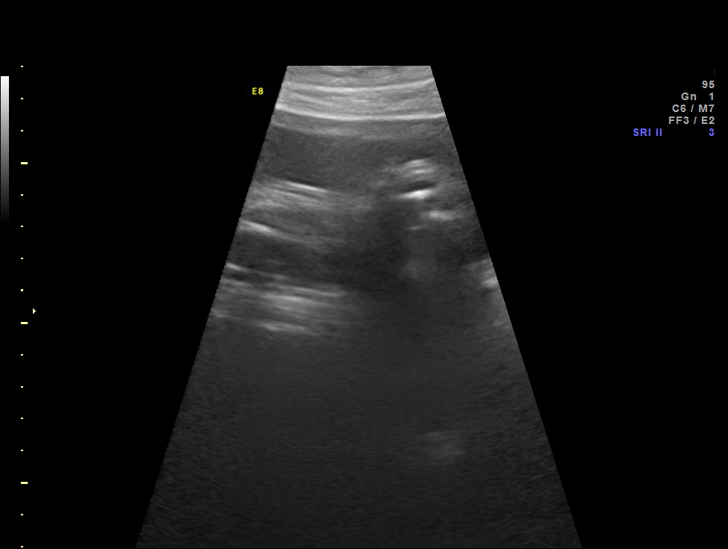
[im 24/24]
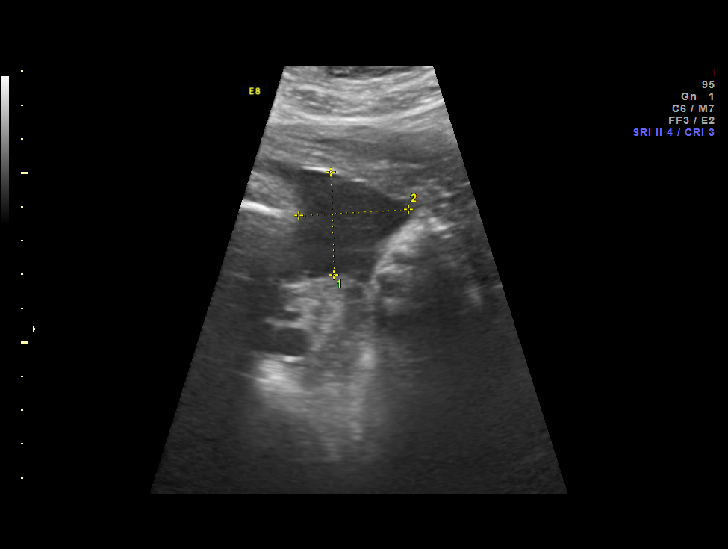

[14 of 24 positions shown; findings below may reference images not displayed]

OBSTETRICS REPORT
                      (Signed Final 06/20/2014 [DATE])

Service(s) Provided

 [HOSPITAL]                                         76815.0
Indications

 Pre-existing essential hypertension complicating
 pregnancy, second trimester - on labetalol
 Advanced maternal age multigravida (43), second
 trimester; low risk quad screen
 Poor obstetrical history - PPROM at 17 weeks
 Obesity complicating pregnancy, second trimester
 33 weeks gestation of pregnancy
Fetal Evaluation

 Num Of Fetuses:    1
 Fetal Heart Rate:  158                          bpm
 Cardiac Activity:  Observed
 Presentation:      Cephalic
 Placenta:          Anterior, above cervical os

 Amniotic Fluid
 AFI FV:      Subjectively low-normal
 AFI Sum:     6.99    cm      < 3  %Tile     Larg Pckt:     3.2  cm
 RUQ:   1.85    cm   LUQ:    3.2    cm    LLQ:   1.94    cm
Gestational Age

 LMP:           33w 5d        Date:  10/27/13                 EDD:   08/03/14
 Best:          33w 5d     Det. By:  LMP  (10/27/13)          EDD:   08/03/14
Impression

 SIUP at 33+5 weeks
 Low normal amniotic fluid volume
 NST reactive
Recommendations

 Continue twice weekly NSTs with weekly AFIs

## 2017-05-29 ENCOUNTER — Emergency Department (HOSPITAL_COMMUNITY)
Admission: EM | Admit: 2017-05-29 | Discharge: 2017-05-29 | Discharge status: Absent without leave | Payer: Commercial Managed Care - PPO

## 2017-10-12 ENCOUNTER — Ambulatory Visit: Payer: Commercial Managed Care - PPO | Admitting: Podiatry

## 2018-05-21 ENCOUNTER — Ambulatory Visit: Payer: Commercial Managed Care - PPO | Admitting: Medical

## 2018-05-21 ENCOUNTER — Encounter: Payer: Self-pay | Admitting: Medical

## 2018-05-21 VITALS — BP 130/76 | HR 78 | Temp 97.6°F | Resp 16 | Ht 68.0 in | Wt 287.4 lb

## 2018-05-21 DIAGNOSIS — Z0000 Encounter for general adult medical examination without abnormal findings: Secondary | ICD-10-CM

## 2018-05-21 DIAGNOSIS — E669 Obesity, unspecified: Secondary | ICD-10-CM

## 2018-05-21 DIAGNOSIS — Z8759 Personal history of other complications of pregnancy, childbirth and the puerperium: Secondary | ICD-10-CM | POA: Insufficient documentation

## 2018-05-21 DIAGNOSIS — Z23 Encounter for immunization: Secondary | ICD-10-CM | POA: Insufficient documentation

## 2018-05-21 DIAGNOSIS — Z7185 Encounter for immunization safety counseling: Secondary | ICD-10-CM | POA: Insufficient documentation

## 2018-05-21 DIAGNOSIS — Z131 Encounter for screening for diabetes mellitus: Secondary | ICD-10-CM

## 2018-05-21 DIAGNOSIS — O10919 Unspecified pre-existing hypertension complicating pregnancy, unspecified trimester: Secondary | ICD-10-CM | POA: Diagnosis not present

## 2018-05-21 DIAGNOSIS — Z7189 Other specified counseling: Secondary | ICD-10-CM

## 2018-05-21 DIAGNOSIS — I10 Essential (primary) hypertension: Secondary | ICD-10-CM | POA: Diagnosis not present

## 2018-05-21 LAB — POCT URINALYSIS DIP (PROADVANTAGE DEVICE)
BILIRUBIN UA: NEGATIVE mg/dL
Bilirubin, UA: NEGATIVE
Blood, UA: NEGATIVE
Glucose, UA: NEGATIVE mg/dL
Leukocytes, UA: NEGATIVE
Nitrite, UA: NEGATIVE
PH UA: 6 (ref 5.0–8.0)
Protein Ur, POC: NEGATIVE mg/dL
Specific Gravity, Urine: 1.03
Urobilinogen, Ur: NEGATIVE

## 2018-05-21 NOTE — Patient Instructions (Signed)
Thanks for trusting [place] with your health care and for coming in for a physical today.  Below are some general recommendations I have for you:  Yearly screenings See your eye doctor yearly for routine vision care. See your dentist yearly for routine dental care including hygiene visits twice yearly. See me here yearly for a routine physical and preventative care visit  Specific Concerns today:  . We will call with lab results  . We updated your flu shot today.  Try to get flu shot in September in the future . Start checking BPs at home.  Goal is 120/70 . Start exercising such as 30 minutes at least 3 days per week as a goal. . Consider getting back in with trainer . Take your child to the pool regulalry for exercise and fun . Consider ketogenic diet or [facility] Diet  Lets change strategies and try something that may work better than what you are currently doing  I want you to eat 3 meals a day +2 snacks, one midmorning snack and one mid afternoon snack  Breakfast You may eat 1 of the following  Omelette, which can include a small amount of cheese, and vegetables such as peppers, mushrooms, small pieces of [place] or chicken  Low sugar yogurt serving which can include some fruit such as berries  Egg whites or hard boiled egg and meat (1-2 strips of bacon, or small piece of [place] sausage or [place] bacon)   Mid-morning snack 1 fruit serving such as one of the following:  medium-sized apple  medium-sized orange,  Tangerine  1/2 banana   3/4 cup of fresh berries or frozen berries  A protein source such as one of the following:  8 almonds   small handful of walnuts or other nuts  small piece of cheese,  low sugar yogurt   Lunch A protein source such as 1 of the following: . 1 serving of beans such as black beans, pinto beans, green beans, or edamame (soy beans) . 1 meat serving such as 6 oz or deck of card size serving of fish, skinless chicken, or [place], either  grilled or baked preferably.   You can use some pork or beef, but limit this compared to fish, chicken or [place] Vegetable - Half of your plate should be a non-starchy vegetables!  So avoid white potatoes and corn.  Otherwise, eat a large portion of vegetables. . Avocado, cucumber, tomato, carrots, greens, lettuce, squash, okra, etc.  . Vegetables can include salad with olive oil/vinaigrette dressing   Mid-afternoon snack 1 fruit serving such as one of the following:  medium-sized apple  medium-sized orange,  Tangerine  1/2 banana   3/4 cup of fresh berries or frozen berries  A protein source such as one of the following:  8 almonds   small handful of walnuts or other nuts  small piece of cheese,  low sugar yogurt   Dinner A protein source such as 1 of the following: . 1 serving of beans such as black beans, pinto beans, green beans, or edamame (soy beans) . 1 meat serving such as 6 oz or deck of card size serving of fish, skinless chicken, or [place], either grilled or baked preferably.   You can use some pork or beef, but limit this compared to fish, chicken or [place] Vegetable - Half of your plate should be a non-starchy vegetables!  So avoid white potatoes and corn.  Otherwise, eat a large portion of vegetables. . Avocado, cucumber,  tomato, carrots, greens, lettuce, squash, okra, etc.  . Vegetables can include salad with olive oil/vinaigrette dressing   Beverages: Water Unsweet tea Home made juice with a juicer without sugar added other than small bit of honey or agave nectar Water with sugar free flavor such as [place]   AVOID.... For the time being I want you to cut out the following items completely: . Soda, sweet tea, juice, beer or wine or alcohol . ALL grains and breads including rice, pasta, bread, cereal . Sweets such as cake, candy, pies, chips, cookies, chocolate    Please follow up yearly for a physical.    I have included other useful information  below for your review.  Preventative Care for Adults - Female      MAINTAIN REGULAR HEALTH EXAMS:  A routine yearly physical is a good way to check in with your primary care provider about your health and preventive screening. It is also an opportunity to share updates about your health and any concerns you have, and receive a thorough all-over exam.   Most health insurance companies pay for at least some preventative services.  Check with your health plan for specific coverages.  WHAT PREVENTATIVE SERVICES DO WOMEN NEED?  Adult women should have their weight and blood pressure checked regularly.   Women age 46 and older should have their cholesterol levels checked regularly.  Women should be screened for cervical cancer with a Pap smear and pelvic exam beginning at either age 46, or 3 years after they become sexually activity.    Breast cancer screening generally begins at age 46 with a mammogram and breast exam by your primary care provider.    Beginning at age 46 and continuing to age 46, women should be screened for colorectal cancer.  Certain people may need continued testing until age 46.  Updating vaccinations is part of preventative care.  Vaccinations help protect against diseases such as the flu.  Osteoporosis is a disease in which the bones lose minerals and strength as we age. Women ages 9165 and over should discuss this with their caregivers, as should women after menopause who have other risk factors.  Lab tests are generally done as part of preventative care to screen for anemia and blood disorders, to screen for problems with the kidneys and liver, to screen for bladder problems, to check blood sugar, and to check your cholesterol level.  Preventative services generally include counseling about diet, exercise, avoiding tobacco, drugs, excessive alcohol consumption, and sexually transmitted infections.    GENERAL RECOMMENDATIONS FOR GOOD HEALTH:  Healthy diet:  Eat a  variety of foods, including fruit, vegetables, animal or vegetable protein, such as meat, fish, chicken, and eggs, or beans, lentils, tofu, and grains, such as rice.  Drink plenty of water daily.  Decrease saturated fat in the diet, avoid lots of red meat, processed foods, sweets, fast foods, and fried foods.  Exercise:  Aerobic exercise helps maintain good heart health. At least 30-40 minutes of moderate-intensity exercise is recommended. For example, a brisk walk that increases your heart rate and breathing. This should be done on most days of the week.   Find a type of exercise or a variety of exercises that you enjoy so that it becomes a part of your daily life.  Examples are running, walking, swimming, water aerobics, and biking.  For motivation and support, explore group exercise such as aerobic class, spin class, Zumba, Yoga,or  martial arts, etc.    Set exercise goals  for yourself, such as a certain weight goal, walk or run in a race such as a 5k walk/run.  Speak to your primary care provider about exercise goals.  Disease prevention:  If you smoke or chew tobacco, find out from your caregiver how to quit. It can literally save your life, no matter how long you have been a tobacco user. If you do not use tobacco, never begin.   Maintain a healthy diet and normal weight. Increased weight leads to problems with blood pressure and diabetes.   The Body Mass Index or BMI is a way of measuring how much of your body is fat. Having a BMI above 27 increases the risk of heart disease, diabetes, hypertension, stroke and other problems related to obesity. Your caregiver can help determine your BMI and based on it develop an exercise and dietary program to help you achieve or maintain this important measurement at a healthful level.  High blood pressure causes heart and blood vessel problems.  Persistent high blood pressure should be treated with medicine if weight loss and exercise do not work.    Fat and cholesterol leaves deposits in your arteries that can block them. This causes heart disease and vessel disease elsewhere in your body.  If your cholesterol is found to be high, or if you have heart disease or certain other medical conditions, then you may need to have your cholesterol monitored frequently and be treated with medication.   Ask if you should have a cardiac stress test if your history suggests this. A stress test is a test done on a treadmill that looks for heart disease. This test can find disease prior to there being a problem.  Menopause can be associated with physical symptoms and risks. Hormone replacement therapy is available to decrease these. You should talk to your caregiver about whether starting or continuing to take hormones is right for you.   Osteoporosis is a disease in which the bones lose minerals and strength as we age. This can result in serious bone fractures. Risk of osteoporosis can be identified using a bone density scan. Women ages 42 and over should discuss this with their caregivers, as should women after menopause who have other risk factors. Ask your caregiver whether you should be taking a calcium supplement and Vitamin D, to reduce the rate of osteoporosis.   Avoid drinking alcohol in excess (more than two drinks per day).  Avoid use of street drugs. Do not share needles with anyone. Ask for professional help if you need assistance or instructions on stopping the use of alcohol, cigarettes, and/or drugs.  Brush your teeth twice a day with fluoride toothpaste, and floss once a day. Good oral hygiene prevents tooth decay and gum disease. The problems can be painful, unattractive, and can cause other health problems. Visit your dentist for a routine oral and dental check up and preventive care every 6-12 months.   Look at your skin regularly.  Use a mirror to look at your back. Notify your caregivers of changes in moles, especially if there are changes  in shapes, colors, a size larger than a pencil eraser, an irregular border, or development of new moles.  Safety:  Use seatbelts 100% of the time, whether driving or as a passenger.  Use safety devices such as hearing protection if you work in environments with loud noise or significant background noise.  Use safety glasses when doing any work that could send debris in to the eyes.  Use  a helmet if you ride a bike or motorcycle.  Use appropriate safety gear for contact sports.  Talk to your caregiver about gun safety.  Use sunscreen with a SPF (or skin protection factor) of 15 or greater.  Lighter skinned people are at a greater risk of skin cancer. Don't forget to also wear sunglasses in order to protect your eyes from too much damaging sunlight. Damaging sunlight can accelerate cataract formation.   Practice safe sex. Use condoms. Condoms are used for birth control and to help reduce the spread of sexually transmitted infections (or STIs).  Some of the STIs are gonorrhea (the clap), chlamydia, syphilis, trichomonas, herpes, HPV (human papilloma virus) and HIV (human immunodeficiency virus) which causes AIDS. The herpes, HIV and HPV are viral illnesses that have no cure. These can result in disability, cancer and death.   Keep carbon monoxide and smoke detectors in your home functioning at all times. Change the batteries every 6 months or use a model that plugs into the wall.   Vaccinations:  Stay up to date with your tetanus shots and other required immunizations. You should have a booster for tetanus every 10 years. Be sure to get your flu shot every year, since 5%-20% of the [place] population comes down with the flu. The flu vaccine changes each year, so being vaccinated once is not enough. Get your shot in the fall, before the flu season peaks.   Other vaccines to consider:  Human Papilloma Virus or HPV causes cancer of the cervix, and other infections that can be transmitted from person to  person. There is a vaccine for HPV, and females should get immunized between the ages of 2811 and 2226. It requires a series of 3 shots.   Pneumococcal vaccine to protect against certain types of pneumonia.  This is normally recommended for adults age 46 or older.  However, adults younger than 46 years old with certain underlying conditions such as diabetes, heart or lung disease should also receive the vaccine.  Shingles vaccine to protect against Varicella Zoster if you are older than age 460, or younger than 46 years old with certain underlying illness.  If you have not had the Shingrix vaccine, please call your insurer to inquire about coverage for the Shingrix vaccine given in 2 doses.   Some insurers cover this vaccine after age 350, some cover this after age 46.  If your insurer covers this, then call to schedule appointment to have this vaccine here  Hepatitis A vaccine to protect against a form of infection of the liver by a virus acquired from food.  Hepatitis B vaccine to protect against a form of infection of the liver by a virus acquired from blood or body fluids, particularly if you work in health care.  If you plan to travel internationally, check with your local health department for specific vaccination recommendations.  Cancer Screening:  Breast cancer screening is essential to preventive care for women. All women age 46 and older should perform a breast self-exam every month. At age 46 and older, women should have their caregiver complete a breast exam each year. Women at ages 5040 and older should have a mammogram (x-ray film) of the breasts. Your caregiver can discuss how often you need mammograms.    Cervical cancer screening includes taking a Pap smear (sample of cells examined under a microscope) from the cervix (end of the uterus). It also includes testing for HPV (Human Papilloma Virus, which can cause cervical cancer). Screening  and a pelvic exam should begin at age 42, or 3 years  after a woman becomes sexually active. Screening should occur every year, with a Pap smear but no HPV testing, up to age 54. After age 88, you should have a Pap smear every 3 years with HPV testing, if no HPV was found previously.   Most routine colon cancer screening begins at the age of 39. On a yearly basis, doctors may provide special easy to use take-home tests to check for hidden blood in the stool. Sigmoidoscopy or colonoscopy can detect the earliest forms of colon cancer and is life saving. These tests use a small camera at the end of a tube to directly examine the colon. Speak to your caregiver about this at age 62, when routine screening begins (and is repeated every 5 years unless early forms of pre-cancerous polyps or small growths are found).

## 2018-05-21 NOTE — Progress Notes (Signed)
Subjective:   HPI  Jacqueline Rowe is a 48 y.o. female who presents for Chief Complaint  Patient presents with  . NP    NP CPE fasting  Patient has GYN appt tomorrow     Medical care team includes: Kristian CoveyShane Giovannina Mun, PA - establishing here for primary care today Cardiology, Dr. Sharyn LullHarwani, last visit 2018 Dentist, [facility] Gynecology - new one with visit tomorrow, Dr. Debbora DusBeth Lane, [facility]  Concerns: Here for flu shot today  Had Tdap updated with her last pregnancy 4 years ago  HTN - not checking medication.   medication compliance is pretty good, but forgets sometimes.  Not exercising  Does have some chronic back pain.    Over weekend, had bad headache, from left side to back down to chest wall.  No vision or hearing changes, no numbness, but had some tinging in left arm.   Does get some chronic back pain, worse in lumbar, but paint throughout.  Just started using some CBD oil for back pain  Has chronic back pain.   Was seeing trainer for about a year in recent years, but quit going due to back spasm  Reviewed their medical, surgical, family, social, medication, and allergy history and updated chart as appropriate.   Review of Systems Constitutional: -fever, -chills, -sweats, -unexpected weight change, -decreased appetite, -fatigue Allergy: -sneezing, -itching, -congestion Dermatology: -changing moles, --rash, -lumps ENT: -runny nose, -ear pain, -sore throat, -hoarseness, -sinus pain, -teeth pain, - ringing in ears, -hearing loss, -nosebleeds Cardiology: -chest pain, -palpitations, -swelling, -difficulty breathing when lying flat, -waking up short of breath Respiratory: -cough, -shortness of breath, -difficulty breathing with exercise or exertion, -wheezing, -coughing up blood Gastroenterology: -abdominal pain, -nausea, -vomiting, -diarrhea, -constipation, -blood in stool, -changes in bowel movement, -difficulty swallowing or eating Hematology: -bleeding,  -bruising  Musculoskeletal: +joint aches, +muscle aches, -joint swelling, +back pain, -neck pain, -cramping, -changes in gait Ophthalmology: denies vision changes, eye redness, itching, discharge Urology: -burning with urination, -difficulty urinating, -blood in urine, -urinary frequency, -urgency, -incontinence Neurology: +headache, -weakness, -tingling, -numbness, -memory loss, -falls, -dizziness Psychology: -depressed mood, -agitation, -sleep problems Breast/gyn: -breast tenderness, -discharge, -lumps, -vaginal discharge,- irregular periods, -heavy periods  Past Medical History:  Diagnosis Date  . Chronic back pain   . GERD (gastroesophageal reflux disease)   . History of pre-eclampsia   . Hypertension   . Pregnancy induced hypertension     Past Surgical History:  Procedure Laterality Date  . BREAST SURGERY  2004   reduction  . CESAREAN SECTION N/A 07/12/2014   Procedure: CESAREAN SECTION;  Surgeon: Kathreen CosierBernard A Marshall, MD;  Location: [facility];  Service: Obstetrics;  Laterality: N/A;  . TUBAL LIGATION  2016    Social History   Socioeconomic History  . Marital status: Legally Separated    Spouse name: Not on file  . Number of children: Not on file  . Years of education: Not on file  . Highest education level: Not on file  Occupational History  . Not on file  Social Needs  . Financial resource strain: Not on file  . Food insecurity:    Worry: Not on file    Inability: Not on file  . Transportation needs:    Medical: Not on file    Non-medical: Not on file  Tobacco Use  . Smoking status: Former Games developermoker  . Smokeless tobacco: Never Used  Substance and Sexual Activity  . Alcohol use: Yes    Alcohol/week: 2.0 standard drinks    Types:  1 Glasses of wine, 1 Cans of beer per week    Comment: before pregnant  . Drug use: No  . Sexual activity: Not Currently  Lifestyle  . Physical activity:    Days per week: Not on file    Minutes per session: Not on file  . Stress: Not on  file  Relationships  . Social connections:    Talks on phone: Not on file    Gets together: Not on file    Attends religious service: Not on file    Active member of club or organization: Not on file    Attends meetings of clubs or organizations: Not on file    Relationship status: Not on file  . Intimate partner violence:    Fear of current or ex partner: Not on file    Emotionally abused: Not on file    Physically abused: Not on file    Forced sexual activity: Not on file  Other Topics Concern  . Not on file  Social History Narrative   ** Merged History Encounter **       Lives in an apartment with her mother and 4yo.  Collections/medical billing/customer service.  Exercise - none currently.  04/2018    Family History  Problem Relation Age of Onset  . High blood pressure Mother   . Heart disease Mother        CHF  . High blood pressure Maternal Aunt   . High blood pressure Sister   . Heart disease Sister   . Hypertension Sister   . Diabetes Father   . Heart disease Father 48       MI  . Migraines Neg Hx      Current Outpatient Medications:  .  carvedilol (COREG) 6.25 MG tablet, Take 6.25 mg by mouth daily., Disp: , Rfl:  .  cloNIDine (CATAPRES) 0.3 MG tablet, Take 0.1 mg by mouth daily. , Disp: , Rfl:  .  losartan-hydrochlorothiazide (HYZAAR) 50-12.5 MG tablet, Take 50 tablets by mouth daily. 50-12.5, Disp: , Rfl:  .  ranitidine (ZANTAC) 150 MG tablet, Take 150 mg by mouth 2 (two) times daily., Disp: , Rfl:  .  spironolactone (ALDACTONE) 50 MG tablet, Take 50 mg by mouth daily., Disp: , Rfl:  .  traMADol (ULTRAM) 50 MG tablet, Take 50 mg by mouth as needed., Disp: , Rfl:   No Known Allergies      Objective:  BP 130/76   Pulse 78   Temp 97.6 F (36.4 C) (Oral)   Resp 16   Ht 5\' 8"  (1.727 m)   Wt 287 lb 6.4 oz (130.4 kg)   LMP 04/04/2018 (Exact Date)   SpO2 97%   BMI 43.70 kg/m   General appearance: alert, no distress, WD/WN, African American  female Skin: unremarkable HEENT: normocephalic, conjunctiva/corneas normal, sclerae anicteric, PERRLA, EOMi, nares patent, no discharge or erythema, pharynx normal Oral cavity: MMM, tongue normal, teeth in good repair Neck: supple, no lymphadenopathy, no thyromegaly, no masses, normal ROM, no bruits Chest: non tender, normal shape and expansion Heart: RRR, normal S1, S2, no murmurs Lungs: CTA bilaterally, no wheezes, rhonchi, or rales Abdomen: +bs, soft, non tender, non distended, no masses, no hepatomegaly, no splenomegaly, no bruits Back: mild pain noted with ROM which is full, otherwise non tender, normal ROM, no scoliosis Musculoskeletal: upper extremities non tender, no obvious deformity, normal ROM throughout, lower extremities non tender, no obvious deformity, normal ROM throughout Extremities: no edema, no cyanosis, no clubbing Pulses:  2+ symmetric, upper and lower extremities, normal cap refill Neurological: alert, oriented x 3, CN2-12 intact, strength normal upper extremities and lower extremities, sensation normal throughout, DTRs 2+ throughout, no cerebellar signs, gait normal Psychiatric: normal affect, behavior normal, pleasant  Breast/gyn/rectal - deferred to gynecology     Assessment and Plan :   Encounter Diagnoses  Name Primary?  . Routine general medical examination at a health care facility Yes  . Need for influenza vaccination   . Essential hypertension   . Chronic hypertension in pregnancy   . History of pre-eclampsia   . Obesity with serious comorbidity, unspecified classification, unspecified obesity type   . Screening for diabetes mellitus   . Vaccine counseling     Physical exam - discussed and counseled on healthy lifestyle, diet, exercise, preventative care, vaccinations, sick and well care, proper use of emergency dept and after hours care, and addressed their concerns.    Health screening: Advised they see their eye doctor yearly for routine vision  care. Advised they see their dentist yearly for routine dental care including hygiene visits twice yearly.  Cancer screening Counseled on self breast exams, mammograms, cervical cancer screening  [facility]  Vaccinations: Advised yearly influenza vaccine Counseled on the influenza virus vaccine.  Vaccine information sheet given.  Influenza vaccine given after consent obtained.  Will request copy of prior Tdap from 4 years ago  Acute issues discussed: none  Separate significant chronic issues discussed: Back pain - consider getting back in with trainer.  Exercise!  Consider 3 days per week, and go to the pool regularly  Obesity - consider Weight Watchers diet, Ketogenic eating plan or [employer] diet plan  Set monthly goals for fitness and weight loss  Establish with eye doctor  HTN - not out of refills yet.   C/t current medications.   Jacqueline Rowe was seen today for np.  Diagnoses and all orders for this visit:  Routine general medical examination at a health care facility -     POCT Urinalysis DIP (Proadvantage Device) -     Comprehensive metabolic panel -     CBC -     Lipid panel -     TSH -     Hemoglobin A1c  Need for influenza vaccination -     Flu Vaccine QUAD 6+ mos PF IM (Fluarix Quad PF)  Essential hypertension  Chronic hypertension in pregnancy  History of pre-eclampsia  Obesity with serious comorbidity, unspecified classification, unspecified obesity type -     TSH -     Hemoglobin A1c  Screening for diabetes mellitus -     Hemoglobin A1c  Vaccine counseling    Follow-up pending labs, yearly for physical

## 2018-05-22 LAB — TSH: TSH: 2.35 u[IU]/mL (ref 0.450–4.500)

## 2018-05-22 LAB — LIPID PANEL
CHOL/HDL RATIO: 3.6 ratio (ref 0.0–4.4)
Cholesterol, Total: 191 mg/dL (ref 100–199)
HDL: 53 mg/dL (ref >39)
LDL CALC: 113 mg/dL — AB (ref 0–99)
Triglycerides: 124 mg/dL (ref 0–149)
VLDL CHOLESTEROL CAL: 25 mg/dL (ref 5–40)

## 2018-05-22 LAB — COMPREHENSIVE METABOLIC PANEL
A/G RATIO: 1.4 (ref 1.2–2.2)
ALT: 14 IU/L (ref 0–32)
AST: 17 IU/L (ref 0–40)
Albumin: 4.3 g/dL (ref 3.8–4.8)
Alkaline Phosphatase: 64 IU/L (ref 39–117)
BUN/Creatinine Ratio: 12 (ref 9–23)
BUN: 11 mg/dL (ref 6–24)
Bilirubin Total: <0.2 mg/dL (ref 0.0–1.2)
CO2: 27 mmol/L (ref 20–29)
CREATININE: 0.9 mg/dL (ref 0.57–1.00)
Calcium: 9.5 mg/dL (ref 8.7–10.2)
Chloride: 101 mmol/L (ref 96–106)
GFR calc non Af Amer: 77 mL/min/{1.73_m2} (ref >59)
GFR, EST AFRICAN AMERICAN: 89 mL/min/{1.73_m2} (ref >59)
Globulin, Total: 3 g/dL (ref 1.5–4.5)
Glucose: 102 mg/dL — ABNORMAL HIGH (ref 65–99)
Potassium: 5.1 mmol/L (ref 3.5–5.2)
Sodium: 139 mmol/L (ref 134–144)
TOTAL PROTEIN: 7.3 g/dL (ref 6.0–8.5)

## 2018-05-22 LAB — CBC
HEMATOCRIT: 40.8 % (ref 34.0–46.6)
HEMOGLOBIN: 13.6 g/dL (ref 11.1–15.9)
MCH: 29.2 pg (ref 26.6–33.0)
MCHC: 33.3 g/dL (ref 31.5–35.7)
MCV: 88 fL (ref 79–97)
Platelets: 345 10*3/uL (ref 150–450)
RBC: 4.66 x10E6/uL (ref 3.77–5.28)
RDW: 13.2 % (ref 11.7–15.4)
WBC: 5.2 10*3/uL (ref 3.4–10.8)

## 2018-05-22 LAB — HEMOGLOBIN A1C
Est. average glucose Bld gHb Est-mCnc: 134 mg/dL
HEMOGLOBIN A1C: 6.3 % — AB (ref 4.8–5.6)

## 2018-05-22 LAB — HM MAMMOGRAPHY

## 2018-09-10 ENCOUNTER — Ambulatory Visit: Payer: Commercial Managed Care - PPO | Admitting: Medical

## 2018-09-11 ENCOUNTER — Other Ambulatory Visit: Payer: Self-pay

## 2018-09-11 ENCOUNTER — Other Ambulatory Visit: Payer: Self-pay | Admitting: Medical

## 2018-09-11 ENCOUNTER — Encounter: Payer: Self-pay | Admitting: Medical

## 2018-09-11 ENCOUNTER — Ambulatory Visit (INDEPENDENT_AMBULATORY_CARE_PROVIDER_SITE_OTHER): Payer: Commercial Managed Care - PPO | Admitting: Medical

## 2018-09-11 ENCOUNTER — Telehealth: Payer: Self-pay | Admitting: Medical

## 2018-09-11 VITALS — Ht 67.0 in | Wt 261.0 lb

## 2018-09-11 DIAGNOSIS — N926 Irregular menstruation, unspecified: Secondary | ICD-10-CM

## 2018-09-11 DIAGNOSIS — R5383 Other fatigue: Secondary | ICD-10-CM

## 2018-09-11 DIAGNOSIS — E669 Obesity, unspecified: Secondary | ICD-10-CM | POA: Diagnosis not present

## 2018-09-11 DIAGNOSIS — R232 Flushing: Secondary | ICD-10-CM

## 2018-09-11 DIAGNOSIS — R0683 Snoring: Secondary | ICD-10-CM | POA: Diagnosis not present

## 2018-09-11 DIAGNOSIS — I10 Essential (primary) hypertension: Secondary | ICD-10-CM | POA: Diagnosis not present

## 2018-09-11 DIAGNOSIS — R7301 Impaired fasting glucose: Secondary | ICD-10-CM

## 2018-09-11 MED ORDER — ALBUTEROL SULFATE HFA 108 (90 BASE) MCG/ACT IN AERS: 2.0000 | INHALATION_SPRAY | RESPIRATORY_TRACT | 1 refills | Status: DC | PRN

## 2018-09-11 NOTE — Telephone Encounter (Signed)
Call and have her stop Ranitidine/Zantac due to contaminate, recall.    If she needs to use something for reflux, use Pepcid OTC or tums  See other message below

## 2018-09-11 NOTE — Progress Notes (Signed)
Subjective:     Patient ID: Jacqueline Rowe, female   DOB: 08-20-1970, 48 y.o.   MRN: [id number]  This visit type was conducted due to national recommendations for restrictions regarding the COVID-19 Pandemic (e.g. social distancing) in an effort to limit this patient's exposure and mitigate transmission in our community.  Due to their co-morbid illnesses, this patient is at least at moderate risk for complications without adequate follow up.  This format is felt to be most appropriate for this patient at this time.    Documentation for virtual audio and video telecommunications through Zoom encounter:  The patient was located at home. The provider was located in the office. The patient did consent to this visit and is aware of possible charges through their insurance for this visit.  The other persons participating in this telemedicine service were none. Time spent on call was 20 minutes and in review of previous records >25 minutes total.  This virtual service is not related to other E/M service within previous 7 days.   HPI Chief Complaint  Patient presents with  . follow up    follow up blood sugar    Virtual visit today for med check and f/u.  She recently saw cardiology, Dr. Sharyn LullHarwani back in March for BP f/u.   Is suppose to be having echocardiogram soon.  She is compliant with BP medications.  No chest pain.   She does get some night time SOB, using inhaler.  No daytime SOB, no DOE  After last visit here in 04/2018, she got back with her trainer, had new meal plan, was exercising, and has lost some weight.   Is down to 261lb.   With coronavirus issue though has been eating carbs again.   Not exercising like she was before March.  Impaired glucose - no polyuria, no polydipsia.  She denies history of sleep apnea, no prior sleep study.  She does snore, she does still fatigue, no restful sleep.  No daytime somnolence today.  No witnessed apnea.  No other complaint.  Past  Medical History:  Diagnosis Date  . Chronic back pain   . GERD (gastroesophageal reflux disease)   . History of pre-eclampsia   . Hypertension   . Pregnancy induced hypertension    Current Outpatient Medications on File Prior to Visit  Medication Sig Dispense Refill  . albuterol (VENTOLIN HFA) 108 (90 Base) MCG/ACT inhaler Inhale 2 puffs into the lungs as needed.    . carvedilol (COREG) 6.25 MG tablet Take 6.25 mg by mouth daily.    . cloNIDine (CATAPRES) 0.3 MG tablet Take 0.1 mg by mouth daily.     Marland Kitchen. losartan-hydrochlorothiazide (HYZAAR) 50-12.5 MG tablet Take 50 tablets by mouth daily. 50-12.5    . spironolactone (ALDACTONE) 50 MG tablet Take 50 mg by mouth daily.    . traMADol (ULTRAM) 50 MG tablet Take 50 mg by mouth as needed.     No current facility-administered medications on file prior to visit.      Review of Systems As in subjective    Objective:   Physical Exam  Ht 5\' 7"  (1.702 m)   Wt 261 lb (118.4 kg)   BMI 40.88 kg/m   Due to coronavirus pandemic stay at home measures, patient visit was virtual and they were not examined in person.   Gen: wd, wn, nad      Assessment:     Encounter Diagnoses  Name Primary?  . Essential hypertension Yes  . Obesity with  serious comorbidity, unspecified classification, unspecified obesity type   . Snores   . Other fatigue   . Impaired fasting blood sugar   . Irregular periods/menstrual cycles   . Hot flashes     .   Plan:     I will have her come in at her convenience for repeat hemoglobin A1c  Glad to hear she has lost weight, encouraged her to get back on her eating plan which was a low-carb eating plan but eating multiple times a day to help with metabolism.  Advised her to get back on her exercise plan  We will request records from cardiology from where she saw him in March.  She is supposed to have an echocardiogram soon.  We discussed the possibility of sleep apnea.  Given her numerous medicines for blood  pressure, long history of hypertension, morbid obesity, a sleep study would be reasonable.  She does have some symptoms including snoring, fatigue, non-restful sleep.  She will think about it and let me know if she wants to move forward  Her periods are slowing down and she is having some hot flashes concerns for perimenopausal.  Danely was seen today for follow up.  Diagnoses and all orders for this visit:  Essential hypertension  Obesity with serious comorbidity, unspecified classification, unspecified obesity type  Snores  Other fatigue  Impaired fasting blood sugar -     Hemoglobin A1c; Future  Irregular periods/menstrual cycles  Hot flashes

## 2018-09-11 NOTE — Telephone Encounter (Signed)
Please call and get last office note and echo from Dr. Harwani/caridology and please send copy of last office note and labs from [place] to him.

## 2018-09-11 NOTE — Telephone Encounter (Signed)
Patient can not take pepcid or tums it makes her stomach upset.   He only takes ranitidine when needed not a lot she states.    Called to get notes from Dr Sharyn Lull office and faxed ov notes and labs to them.

## 2018-09-11 NOTE — Telephone Encounter (Signed)
There was a recall on Ranitidine/Zantac due to contaminate, so will need to try something else.  What else has she used with success in the past?

## 2018-09-12 ENCOUNTER — Other Ambulatory Visit: Payer: Self-pay | Admitting: Medical

## 2018-09-12 MED ORDER — FAMOTIDINE 40 MG PO TABS: 40.0000 mg | ORAL_TABLET | Freq: Every evening | ORAL | 1 refills | Status: DC

## 2018-09-12 NOTE — Telephone Encounter (Signed)
I sent famotidine to her pharmacy to replace ranitidine.  If the same type of medicine but there are no currently known contaminants or recalls on this medicine.  It should work the same way, so it should work fine for her.

## 2018-09-12 NOTE — Telephone Encounter (Signed)
Patient notified

## 2018-09-12 NOTE — Telephone Encounter (Signed)
Patient states that she ranitidine is prescription and that she can only take that she can not take zantac.  She has not tried anything else in the past.

## 2018-09-24 ENCOUNTER — Encounter: Payer: Self-pay | Admitting: Medical

## 2018-09-27 ENCOUNTER — Other Ambulatory Visit: Payer: Self-pay | Admitting: Medical

## 2018-09-27 NOTE — Telephone Encounter (Signed)
Is this ok to refill for 90 days 

## 2018-10-23 ENCOUNTER — Other Ambulatory Visit: Payer: Self-pay | Admitting: Medical

## 2018-11-14 ENCOUNTER — Institutional Professional Consult (permissible substitution): Payer: Commercial Managed Care - PPO | Admitting: Medical

## 2018-11-19 ENCOUNTER — Institutional Professional Consult (permissible substitution): Payer: Commercial Managed Care - PPO | Admitting: Medical

## 2018-11-21 ENCOUNTER — Encounter: Payer: Self-pay | Admitting: Medical

## 2019-02-07 ENCOUNTER — Other Ambulatory Visit: Payer: Self-pay | Admitting: Medical

## 2019-03-13 ENCOUNTER — Encounter: Payer: Self-pay | Admitting: Medical

## 2019-03-13 ENCOUNTER — Other Ambulatory Visit: Payer: Self-pay

## 2019-03-13 ENCOUNTER — Ambulatory Visit: Payer: Commercial Managed Care - PPO | Admitting: Medical

## 2019-03-13 VITALS — BP 128/72 | HR 82 | Temp 98.0°F | Ht 67.0 in | Wt 303.8 lb

## 2019-03-13 DIAGNOSIS — G8929 Other chronic pain: Secondary | ICD-10-CM | POA: Insufficient documentation

## 2019-03-13 DIAGNOSIS — M79604 Pain in right leg: Secondary | ICD-10-CM | POA: Diagnosis not present

## 2019-03-13 DIAGNOSIS — M549 Dorsalgia, unspecified: Secondary | ICD-10-CM | POA: Diagnosis not present

## 2019-03-13 DIAGNOSIS — R7301 Impaired fasting glucose: Secondary | ICD-10-CM

## 2019-03-13 DIAGNOSIS — I10 Essential (primary) hypertension: Secondary | ICD-10-CM

## 2019-03-13 LAB — POCT GLYCOSYLATED HEMOGLOBIN (HGB A1C): Hemoglobin A1C: 6.5 % — AB (ref 4.0–5.6)

## 2019-03-13 MED ORDER — METHOCARBAMOL 500 MG PO TABS: 500.0000 mg | ORAL_TABLET | Freq: Every evening | ORAL | 0 refills | Status: AC | PRN

## 2019-03-13 MED ORDER — TRAMADOL HCL 50 MG PO TABS: 50.0000 mg | ORAL_TABLET | Freq: Three times a day (TID) | ORAL | 0 refills | Status: AC | PRN

## 2019-03-13 NOTE — Progress Notes (Signed)
Subjective: Chief Complaint  Patient presents with  . Back Pain    entire back   Here for back pain.  Been having back pain for some years, gets some muscle spasms from time to time.  Whole back hurts, but worse in mid to lower back.  For past 4 days could barely move.  Was in bed most of the last few days.   Feels like when she goes to walk feels like her back is coming apart like it will cripple her.  Last imaging was CT scan of back , [facility] in [place], [place].   At that time report showed mild arthritis, mild curvature. No prior PT.    No recent injury or trauma.   Works from home, Research scientist (physical sciences), on the phone.     About a year ago as doing some exercise and had lost weight with keto diet but fell off the wagon.     Has been trying to start back with exercise.  Does get some pains that radiate down the hips and legs.   Sometimes gets some tingling in right thigh.    No recent fevers, no blood in stool or urine, no numbness in genitals.    Using tylenol to help with pain.    She request help with medication to lose weight   No other aggravating or relieving factors. No other complaint.  Past Medical History:  Diagnosis Date  . Chronic back pain   . GERD (gastroesophageal reflux disease)   . History of pre-eclampsia   . Hypertension   . Pregnancy induced hypertension    Current Outpatient Medications on File Prior to Visit  Medication Sig Dispense Refill  . carvedilol (COREG) 25 MG tablet Take 25 mg by mouth 2 (two) times daily.    . cloNIDine (CATAPRES) 0.3 MG tablet Take 0.1 mg by mouth daily.     . famotidine (PEPCID) 40 MG tablet TAKE 1 TABLET BY MOUTH EVERY DAY IN THE EVENING 90 tablet 1  . losartan-hydrochlorothiazide (HYZAAR) 50-12.5 MG tablet Take 50 tablets by mouth daily. 50-12.5    . spironolactone (ALDACTONE) 50 MG tablet Take 50 mg by mouth daily.    Marland Kitchen albuterol (VENTOLIN HFA) 108 (90 Base) MCG/ACT inhaler Inhale 2 puffs into the lungs as needed.  (Patient not taking: Reported on 03/13/2019) 1 Inhaler 1  . traMADol (ULTRAM) 50 MG tablet Take 50 mg by mouth as needed.     No current facility-administered medications on file prior to visit.      Objective: BP 128/72   Pulse 82   Temp 98 F (36.7 C)   Ht 5\' 7"  (1.702 m)   Wt (!) 303 lb 12.8 oz (137.8 kg)   SpO2 98%   BMI 47.58 kg/m   Wt Readings from Last 3 Encounters:  03/13/19 (!) 303 lb 12.8 oz (137.8 kg)  09/11/18 261 lb (118.4 kg)  05/21/18 287 lb 6.4 oz (130.4 kg)   Gen: wd, wn, nad Morbidly obese Back tender midline lumbar region, but no other tenderness.  Flexion reduced to about 100 degrees, extension normal but flexion and ext with pain.   No obvious scoliosis.   Hips nontneder, legs othewrise nontender and no deformity Neuro: -slr, normal heel and toe walk, normal LE strength, sensation and DTRs Abdomen : nontender, no mass, no organomegaly     Assessment: Encounter Diagnoses  Name Primary?  . Chronic bilateral back pain, unspecified back location Yes  . Right leg pain   .  Morbid obesity (HCC)   . Essential hypertension   . Impaired fasting blood sugar      Plan: Chronic back pain, right leg pain likely radicular in nature, obesity - discussed need to lose weight, to move throughout the day working from home in a sitting position all day, discussed need for regular stretching and daily exercise.   Referral to PT.   Can use medications below prn for spasm and pain accordingly.  Discussed proper use of medication, risks of medication.   Ultram prn 1 or 2 times daily prn. We will get copy of CT lumbar spine done about 2 years ago.  Obesity, impaired glucose - HgbA1C lab today as she was prediabetic in 04/2018.  Counseled on low carb diet, counseled on exercise recommendations.  Consider Saxenda, Qsymia medications.  She will check insurance coverage for these to help with efforts.    HTN - c/t current medications.   Impaired glucose - lab today  Manasvini  was seen today for back pain.  Diagnoses and all orders for this visit:  Chronic bilateral back pain, unspecified back location -     Ambulatory referral to Physical Therapy  Right leg pain -     Ambulatory referral to Physical Therapy  Morbid obesity (HCC) -     Ambulatory referral to Physical Therapy  Essential hypertension  Impaired fasting blood sugar  Other orders -     traMADol (ULTRAM) 50 MG tablet; Take 1 tablet (50 mg total) by mouth every 8 (eight) hours as needed for up to 5 days. -     methocarbamol (ROBAXIN) 500 MG tablet; Take 1 tablet (500 mg total) by mouth at bedtime as needed for muscle spasms.  f/u pending lab, PT referral

## 2019-03-13 NOTE — Addendum Note (Signed)
Addended by: Edgar Frisk on: 03/13/2019 02:59 PM   Modules accepted: Orders

## 2019-03-13 NOTE — Patient Instructions (Signed)
Check insurance coverage for Qsymia and Saxenda weight management medications.    Exercise: I recommend exercising most days of the week using a type of exercise that you would enjoy and stick to such as walking, running, swimming, hiking, biking, aerobics, etc. This needs to be at least 30-40 minutes at a time, at least 5 days/week with moderate intensity.    Low Carb Diet recommendations:  I recommend you drink water throughout the day.   70 ounces or 2 liters would be a good amount.  If you have been accustomed to drinking juice or soda, try water with lemon or water with lime, or try using no calorie flavor dropper.  I recommend the following as an example meal plan that includes 3 meals per day.   You can skip some meals periodically for intermittent fasting.  Breakfast (choose one): Marland Kitchen Omelette with egg, can include a little bit of cheese, your choice of mushrooms, peppers, onions, salsa . Smoothie with handful of spinach or kale, 1 cup of milk or water, 1 cup of berries, 1 packet of artificial sweetener such as stevia . Yogurt with fruit   Mid morning snack: . 1 fruit serving and 1 protein such as 8 almonds or 8 nuts, or vegetable such as carrots and humus or other similar vegetable   Lunch: . Salad with 3-4 ounces of lean grilled or baked meat such as fish, chicken or [place]   Mid afternoon snack: . 1 fruit serving and 1 protein such as 8 almonds or 8 nuts, or vegetable such as carrots and humus or other similar vegetable   Dinner: . Large serving of vegetables and 3-4 ounces of lean grilled or baked meat such as fish, chicken or [place] . Or vegetarian dish without meat    Avoid  . Chips, cookies, cake, donuts, soda, sweet tea, juices, candy, fast food . For now , avoid, or significantly limit grains

## 2019-03-19 ENCOUNTER — Other Ambulatory Visit: Payer: Self-pay | Admitting: Medical

## 2019-03-19 ENCOUNTER — Telehealth: Payer: Self-pay

## 2019-03-19 DIAGNOSIS — M79604 Pain in right leg: Secondary | ICD-10-CM

## 2019-03-19 DIAGNOSIS — G8929 Other chronic pain: Secondary | ICD-10-CM

## 2019-03-19 DIAGNOSIS — M549 Dorsalgia, unspecified: Secondary | ICD-10-CM

## 2019-03-19 NOTE — Telephone Encounter (Signed)
Please try and call her back.  There are 3 very clear pathways forwards:  1- if can't stand and that severe of symptoms, she should go to the emergency dept for urgent evaluation and possible urgent scan/possible need for urgent surgery!      2 - if this is not the case, then c/t plan for PT and I have put in order for MRI

## 2019-03-19 NOTE — Telephone Encounter (Signed)
Patient has stated that she is unable to stand and at this point she just want to have a MRI done. Before I could finish reading your message to patient she hung up the phone. Please advise next step.

## 2019-03-19 NOTE — Telephone Encounter (Signed)
Pt. Called stating that she was seen here for her back on 03/13/19 and you guys mentioned waiting to do an MRI, pt. Stats she is in a lot of pain with her back and can not get out of her bed now, she would like for you to order the MRI now said something bad is going on in her back.

## 2019-03-19 NOTE — Telephone Encounter (Signed)
We had talked about doing physical therapy first which we already made the referral.  Has she talked to physical therapy yet, as she started physical therapy?  We generally try physical therapy for the 2 to 4 weeks before ordering an MRI.  However if red flag symptoms such as fever, numbness in the genitals, incontinence of bowels or bladder that is new, severe 10 out of 10 pain with involvement of the leg including numbness or tingling, or if she literally is falling and cannot stand, then she either should go to the hospital if that severe or let me know specifically how much worse of the symptoms she has so we can decide if we need to pursue MRI now

## 2019-03-20 NOTE — Telephone Encounter (Signed)
Tried to call pt but vm is full 

## 2019-04-06 ENCOUNTER — Other Ambulatory Visit: Payer: Commercial Managed Care - PPO

## 2019-04-27 ENCOUNTER — Other Ambulatory Visit: Payer: Commercial Managed Care - PPO

## 2019-05-13 ENCOUNTER — Ambulatory Visit: Payer: Commercial Managed Care - PPO | Admitting: Medical

## 2019-05-21 ENCOUNTER — Encounter: Payer: Self-pay | Admitting: Medical

## 2019-05-21 ENCOUNTER — Ambulatory Visit: Payer: Commercial Managed Care - PPO | Admitting: Medical

## 2019-05-21 ENCOUNTER — Other Ambulatory Visit: Payer: Self-pay

## 2019-05-21 ENCOUNTER — Telehealth: Payer: Self-pay | Admitting: Medical

## 2019-05-21 VITALS — Ht 67.0 in | Wt 300.0 lb

## 2019-05-21 DIAGNOSIS — G8929 Other chronic pain: Secondary | ICD-10-CM

## 2019-05-21 DIAGNOSIS — R7301 Impaired fasting glucose: Secondary | ICD-10-CM

## 2019-05-21 DIAGNOSIS — M549 Dorsalgia, unspecified: Secondary | ICD-10-CM | POA: Diagnosis not present

## 2019-05-21 DIAGNOSIS — I10 Essential (primary) hypertension: Secondary | ICD-10-CM | POA: Diagnosis not present

## 2019-05-21 DIAGNOSIS — R454 Irritability and anger: Secondary | ICD-10-CM | POA: Insufficient documentation

## 2019-05-21 DIAGNOSIS — R7303 Prediabetes: Secondary | ICD-10-CM | POA: Insufficient documentation

## 2019-05-21 DIAGNOSIS — Z0289 Encounter for other administrative examinations: Secondary | ICD-10-CM

## 2019-05-21 DIAGNOSIS — Z8249 Family history of ischemic heart disease and other diseases of the circulatory system: Secondary | ICD-10-CM | POA: Diagnosis not present

## 2019-05-21 NOTE — Progress Notes (Signed)
Subjective:     Patient ID: Jacqueline Rowe, female   DOB: 10-16-1970, 49 y.o.   MRN: [id number]  This visit type was conducted due to national recommendations for restrictions regarding the COVID-19 Pandemic (e.g. social distancing) in an effort to limit this patient's exposure and mitigate transmission in our community.  Due to their co-morbid illnesses, this patient is at least at moderate risk for complications without adequate follow up.  This format is felt to be most appropriate for this patient at this time.    Documentation for virtual audio and video telecommunications through Zoom encounter:  The patient was located at home. The provider was located in the office. The patient did consent to this visit and is aware of possible charges through their insurance for this visit.  The other persons participating in this telemedicine service were none. Time spent on call was 20 minutes and in review of previous records 20 minutes total.  This virtual service is not related to other E/M service within previous 7 days.    HPI Chief Complaint  Patient presents with  . Follow-up    back pain-did not have MRI done   Virtual consult for recheck.    Medical team: Gynecology, Dr. Debbora Dus [facility] Dr. Rinaldo Cloud, cardiology   Hypertension-she is compliant with her medications including carvedilol twice daily, losartan HCT, spironolactone, and clonidine.  She notes no prior stress test, but has had echocardiograms.   She denies chest pain, difficulty breathing, palpitations.  She has family history of premature heart disease.  She is supposed to see cardiology again next month February 2021  Prediabetes on prior labs-she currently denies any diabetes symptoms, no polyuria, no polydipsia, he is trying to eat healthier.  Exercise is limited by back pain at times.   Her main concern today is her ongoing chronic back pain.  I last saw her for back pain in March 13, 2019.  At that time she advised me that she had been having back pain for years, muscle spasms from time to time, back pain throughout the whole back, days of her last visit she said her back pain was crippling her.  At her last visit she even mentioned trouble walking at times due to the pain.  On that visit I had recommended referral to physical therapy, and we tried to obtain prior CT scan done reportedly 2 years ago.  We have never received a CT report.  At that visit medications were prescribed to help with her symptoms, and we made the referral to physical therapy.  She called back on November 24 and inquired about the MRI.  She had mention on the phone that day of severe pain, and mention bad enough pain that she could not get out of the bed.   We had given her recommendations to go to the emergency department if severe pain, fever, paresthesias in the genitals, incontinence or other red flag symptoms.  We also advised that we would go ahead and try to expedite the scheduling as this requires prior authorization.  Looking back in the chart notes and telephone encounter from that day, CMA noted that she hung up the phone before CMA could finish going over the recommendations.  CMA tried to call back but the voicemail was full and no answer.  The MRI was scheduled, but no other correspondence had been made up until this visit.  She tells me today that she never went to physical therapy because she could  not afford the co-pays.  She also notes that the MRI never got done because she could not fit it into her schedule.  She gave several excuses for why she was unable to do therapy and/or MRI.  Of note, in the telephone record her chart record she never called back after the 03/19/2019 phone message regarding getting the MRI set up.  Thus we were unaware that she was having either difficulty with co-pays or difficulty with going for the MRI due to scheduling.  She at this point seems to be getting irritated on  the phone, assuming that we knew about the difficulties which we did not.  She seemed irritated that she was being asked to do evaluation.  She noted that we have talked about back pain numerous times before.  (Of note looking through the chart record it was briefly discussed at her very first visit here for a physical or preventative care visit.  We make it clear to patients on physical visit is that the visit is about preventive care, discussing medical history, physical examination and giving input and feedback on what things need to be addressed from a preventative care standpoint.  So we did not specifically elaborate on her back pain on her first visit which was a physical).  She did not really come back in and discuss severe back pain or ongoing back pain problems to the point where this was causing daily or weekly problems until her visit in November 2020.   Of note she was a new patient test in January 2020.     She inquired about FMLA paperwork today.  We have never done her paperwork before.  She is requesting FMLA form to be completed.  She notes this is due to her chronic back pain.  She would not specify how many days a month she has missed in recent months due to back pain.  At this point in our conversation she became irritated with me and frustrated.  She seemed to be irritated that she was being asked to do other evaluation for her back instead of just he agreed to complete her FMLA without question.  I asked several times coming days per month she has historically missed for back pain but she would not give me a clear answer.  She did not care for my reply that I would be willing to write 1 day/month until we have further evaluation.  During this point in the conversation she expressed her frustration, asked that she just needed to go back to her prior PCP or what the neck step was, but she no longer wanted to talk to me today, and all she cared for would be her FMLA that she must have.  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   Past Medical History:  Diagnosis Date  . Chronic back pain   . GERD (gastroesophageal reflux disease)   . History of pre-eclampsia   . Hypertension   . Pregnancy induced hypertension    Current Outpatient Medications on File Prior to Visit  Medication Sig Dispense Refill  . carvedilol (COREG) 25 MG tablet Take 25 mg by mouth 2 (two) times daily.    . cloNIDine (CATAPRES) 0.3 MG tablet Take 0.1 mg by mouth daily.     . famotidine (PEPCID) 40 MG tablet TAKE 1 TABLET BY MOUTH EVERY DAY IN THE EVENING 90 tablet 1  . losartan-hydrochlorothiazide (HYZAAR) 50-12.5 MG tablet Take  50 tablets by mouth daily. 50-12.5    . methocarbamol (ROBAXIN) 500 MG tablet Take 1 tablet (500 mg total) by mouth at bedtime as needed for muscle spasms. 30 tablet 0  . spironolactone (ALDACTONE) 50 MG tablet Take 50 mg by mouth daily.    . traMADol (ULTRAM) 50 MG tablet Take 50 mg by mouth as needed.    Marland Kitchen albuterol (VENTOLIN HFA) 108 (90 Base) MCG/ACT inhaler Inhale 2 puffs into the lungs as needed. (Patient not taking: Reported on 03/13/2019) 1 Inhaler 1   No current facility-administered medications on file prior to visit.   Family History  Problem Relation Age of Onset  . High blood pressure Mother   . Heart disease Mother        CHF  . High blood pressure Maternal Aunt   . High blood pressure Sister   . Heart disease Sister   . Hypertension Sister   . Diabetes Father   . Heart disease Father 65       MI  . Migraines Neg Hx      Review of Systems As in subjective    Objective:   Physical Exam Due to coronavirus pandemic stay at home measures, patient visit was virtual and they were not examined in person.   Ht 5\' 7"  (1.702 m)   Wt 300 lb (136.1 kg)   BMI 46.99 kg/m   BP Readings from Last 3 Encounters:  03/13/19 128/72  05/21/18 130/76  07/25/14 (!) 156/75   Wt Readings from Last 3 Encounters:  05/21/19  300 lb (136.1 kg)  03/13/19 (!) 303 lb 12.8 oz (137.8 kg)  09/11/18 261 lb (118.4 kg)        Assessment:     Encounter Diagnoses  Name Primary?  . Essential hypertension Yes  . Impaired fasting blood sugar   . Chronic bilateral back pain, unspecified back location   . Family history of premature CAD   . Prediabetes   . Morbid obesity (HCC)   . Encounter for completion of form with patient        Plan:     Of note this visit did not go as planned or as typical.  Before she became upset about the FMLA and back pain issues, we did discussed the other noted problems.  Hypertension-continue current medication, and follow-up with Dr. 09/13/18 cardiology in February as planned.   I reviewed her 2016 echocardiogram which was abnormal.  Given her family history of premature CAD I advise she consider a stress test with Dr. 2017 since she has never had that before and given her other risk factors of morbid obesity, prediabetes, family history, hypertension.   Looking back over the chart after our visit ended she apparently had an updated echocardiogram just this past year which I did not see until later.    Impaired glucose-we discussed that she is prediabetic.  Counseled on diet, exercise, trying to prevent further progression to diabetes  Obesity-needs to continue efforts to lose weight  Chronic back pain, request for FMLA - prior to this visit, I don't recall any issues with her being upset or frustrated other than the 03/19/2019 phone message where she hung up the phone on my CMA.   I just started seeing her as a new patient 04/2018.  We discussed her concerns, her chronic back pain and FMLA request.   I advised that I have never completed FMLA for her and this is the first time she has requested this although  I have been seeing her a year now.   She was quite upset on the phone and had the perception we had discussed her back pain in great detail multiple visits.   On her initial visit  which was not a problem based visit but a preventative care physical visit, we apparently briefly discussed back pain, exercise and ways to deal with pain such as exercise, stretching, weight loss, OTC analgesics prn.   We didn't spend an extraordinary amount of time on the topic of back pain on the 04/2018 compared to her perception of how that visit went.    The next time we evaluated her for back pain was 02/2019.   For whatever reason she feels as though she has been seen here several times for back pain which is not true.    I advised that we typically evaluated a person's complaint of back pain, and if warranted pursue physical therapy or imaging which is what we recommended in 02/2019.    The first time I became aware that she would not or could not attend physical therapy or pursue MRI was today.   She apparently never called back in 02/2019 to let us know she either could not meet those appointments or get the MRI scan due to scheduling.   She was demanding today about having her FMLA completed and wasn't in agreement to only be allowed 1 day per month absence due to back pain which is all I could offer without other evaluation.   Of note, I have no former imaging to review, she would not go for MRI, she declined to attend physical therapy, and really didn't seem interested in any thing other than FMLA today.  At this point she agreed to referral to back specialist and really didn't want to take the conversation any further.   I was quite surprised to her reaction today, and I contacted my office manager about the conversation as well today.    Kylee was seen today for follow-up.  Diagnoses and all orders for this visit:  Essential hypertension  Impaired fasting blood sugar  Chronic bilateral back pain, unspecified back location -     AMB referral to orthopedics  Family history of premature CAD  Prediabetes  Morbid obesity (HCC)  Encounter for completion of form with patient

## 2019-05-21 NOTE — Telephone Encounter (Signed)
Jacqueline Rowe - FYI:  Please see visit notes from today given the unusual conversation I discussed with you.   Patient was upset and frustrated and I don't think she could look past her frustration and insistence on FMLA to hear my reasoning on next steps.

## 2019-05-22 NOTE — Telephone Encounter (Signed)
Make sure we try to refer to orthopedic/back specialist ASAP, preferably with [facility]

## 2019-05-22 NOTE — Telephone Encounter (Signed)
I have reviewed your notes and letter to patient.  A referral to the specialist is needed. The patient will need to follow through as you have requested.

## 2019-05-23 NOTE — Telephone Encounter (Signed)
Patient has an appointment scheduled for 06/04/19

## 2019-05-24 ENCOUNTER — Ambulatory Visit
Admission: RE | Admit: 2019-05-24 | Discharge: 2019-05-24 | Disposition: A | Discharge status: Home / Self Care | Payer: Commercial Managed Care - PPO | Source: Ambulatory Visit | Attending: Medical | Admitting: Medical

## 2019-05-24 DIAGNOSIS — G8929 Other chronic pain: Secondary | ICD-10-CM

## 2019-05-24 DIAGNOSIS — M79604 Pain in right leg: Secondary | ICD-10-CM

## 2019-05-29 ENCOUNTER — Telehealth: Payer: Self-pay | Admitting: Medical

## 2019-05-29 ENCOUNTER — Other Ambulatory Visit: Payer: Self-pay | Admitting: Medical

## 2019-05-29 MED ORDER — SPIRONOLACTONE 50 MG PO TABS: 50.0000 mg | ORAL_TABLET | Freq: Every day | ORAL | 0 refills | Status: AC

## 2019-05-29 MED ORDER — LOSARTAN POTASSIUM-HCTZ 50-12.5 MG PO TABS: 1.0000 | ORAL_TABLET | Freq: Every day | ORAL | 0 refills | Status: AC

## 2019-05-29 MED ORDER — CARVEDILOL 25 MG PO TABS: 25.0000 mg | ORAL_TABLET | Freq: Two times a day (BID) | ORAL | 0 refills | Status: AC

## 2019-05-29 MED ORDER — ALBUTEROL SULFATE HFA 108 (90 BASE) MCG/ACT IN AERS: 2.0000 | INHALATION_SPRAY | RESPIRATORY_TRACT | 0 refills | Status: AC | PRN

## 2019-05-29 MED ORDER — FAMOTIDINE 40 MG PO TABS: ORAL_TABLET | ORAL | 0 refills | Status: AC

## 2019-05-29 MED ORDER — CLONIDINE HCL 0.3 MG PO TABS: 0.3000 mg | ORAL_TABLET | Freq: Every day | ORAL | 0 refills | Status: AC

## 2019-05-29 NOTE — Telephone Encounter (Signed)
Pt called she needs refills on her meds, she expressed that she thought refills were being sent after her appointment a few weeks ago  Losartan/hctz carvedilol spironalactone Clonidine Albuterol Famotidine  [facility]

## 2019-05-29 NOTE — Telephone Encounter (Signed)
Medications were sent

## 2019-06-04 ENCOUNTER — Other Ambulatory Visit: Payer: Self-pay

## 2019-06-04 ENCOUNTER — Ambulatory Visit: Payer: Commercial Managed Care - PPO | Admitting: Orthopaedic Surgery

## 2019-06-04 ENCOUNTER — Encounter: Payer: Self-pay | Admitting: Orthopaedic Surgery

## 2019-06-04 ENCOUNTER — Telehealth: Payer: Self-pay | Admitting: Family Medicine

## 2019-06-04 ENCOUNTER — Encounter: Payer: Self-pay | Admitting: Family Medicine

## 2019-06-04 VITALS — Ht 67.0 in | Wt 300.0 lb

## 2019-06-04 DIAGNOSIS — M545 Low back pain, unspecified: Secondary | ICD-10-CM

## 2019-06-04 DIAGNOSIS — G8929 Other chronic pain: Secondary | ICD-10-CM

## 2019-06-04 NOTE — Progress Notes (Signed)
Office Visit Note   Patient: Jacqueline Rowe           Date of Birth: Jul 28, 1970           MRN: [id number] Visit Date: 06/04/2019              Requested by: Jac Canavan, PA-C [address] [place],  [place] [address] PCP: Jac Canavan, PA-C   Assessment & Plan: Visit Diagnoses: No diagnosis found.  Plan: Patient has some facet arthropathy at L4-5 and L5-S1.  L4-5 has more facet fluid and she also has a small annular tear in the subarticular zone at L4-5.  Her principal problem is increased BMI and we we will make referral [facility].  We discussed that they have a waiting list.  We discussed the commitment to try to work on some weight loss currently.  No indications for surgical intervention at this point we discussed she should get significant improvement with weight loss and reduction in her BMI following her wellness program.  She can follow-up with me on a as needed basis.  Follow-Up Instructions: No follow-ups on file.   Orders:  No orders of the defined types were placed in this encounter.  No orders of the defined types were placed in this encounter.     Procedures: No procedures performed   Clinical Data: No additional findings.   Subjective: Chief Complaint  Patient presents with  . Lower Back - Pain    HPI 49 year old female seen with low back pain x4 years since the birth of her daughter.  Patient states she has gained some weight since birth of her child about 50 pounds now weighs 300 with a BMI of 47.  Patient states she has back pain which is from upper lumbar region down to her buttocks sometimes into her thighs she describes as sometimes a stabbing sometimes with numbness or tingling.  She states tramadol makes her sleepy she is used Tylenol during the daytime.  Patient works for a [employer] and has been working at home.  Her 61-year-old daughter is present with her today.  Patient has history of hypertension  prediabetes.  Patient states on the worst day she has trouble walking due to the back pain and also has some pain with sitting.  She did go to therapy but states out-of-pocket cost was $50 and it was cost prohibitive.  Review of Systems 14 point systems are noncontributory she denies chills or fever no associated bowel bladder symptoms.  C-section 4 years ago.   Objective: Vital Signs: Ht 5\' 7"  (1.702 m)   Wt 300 lb (136.1 kg)   BMI 46.99 kg/m   Physical Exam Constitutional:      Appearance: She is well-developed.  HENT:     Head: Normocephalic.     Right Ear: External ear normal.     Left Ear: External ear normal.  Eyes:     Pupils: Pupils are equal, round, and reactive to light.  Neck:     Thyroid: No thyromegaly.     Trachea: No tracheal deviation.  Cardiovascular:     Rate and Rhythm: Normal rate.  Pulmonary:     Effort: Pulmonary effort is normal.  Abdominal:     Palpations: Abdomen is soft.  Skin:    General: Skin is warm and dry.  Neurological:     Mental Status: She is alert and oriented to person, place, and time.  Psychiatric:  Behavior: Behavior normal.     Ortho Exam patient has negative logroll the hips normal heel toe gait with slight shuffling gait.  She is able to heel and toe walk.  Negative straight leg raising 90 degrees negative popliteal compression test.  Some tenderness with palpation of the lumbosacral junction.  No midline defects.  Negative FABER test right and left.  Knee and ankle jerk are 1+ and symmetrical.  Specialty Comments:  No specialty comments available.  Imaging: CLINICAL DATA:  Morbid obesity. Chronic bilateral back pain, unspecified back pain location, right leg pain. Lumbar radiculopathy, prior surgery, new symptoms. Additional history provided by technologist: Patient reports chronic and persistent low back pain that runs into both buttocks and legs with numbness in both legs.  EXAM: MRI LUMBAR SPINE WITHOUT CONTRAST   TECHNIQUE: Multiplanar, multisequence MR imaging of the lumbar spine was performed. No intravenous contrast was administered.  COMPARISON:  Report from CT abdomen/pelvis 09/22/2009 (images unavailable)  FINDINGS: Segmentation: For the purposes of this dictation, five lumbar vertebrae are assumed and the caudal most well-formed intervertebral disc is designated L5-S1.  Alignment: Straightening of the expected lumbar lordosis. Trace L5-S1 retrolisthesis.  Vertebrae: Vertebral body height is maintained. Mixed degenerative endplate marrow signal anteriorly at L5-S1, which includes mild degenerative endplate edema. No significant marrow edema demonstrated elsewhere within the lumbar spine. No suspicious osseous lesion.  Conus medullaris and cauda equina: Conus extends to the L2 level. No signal abnormality within the visualized distal spinal cord.  Paraspinal and other soft tissues: No abnormality identified within included portions of the abdomen/retroperitoneum. Mild atrophy of the lumbar paraspinal musculature.  Disc levels:  Mild multilevel disc degeneration greatest at L5-S1.  At the T12-L1 through L3-L4 levels there is no significant disc herniation, spinal canal stenosis or neural foraminal narrowing.  L4-L5: A posterior annular fissure extends from the right to the left subarticular zones. Minimal endplate spurring. No significant disc herniation, spinal canal or neural foraminal narrowing.  L5-S1: Disc bulge with endplate spurring. Mild facet arthrosis. No significant spinal canal stenosis. Bilateral neural foraminal narrowing (mild right, mild/moderate left).  IMPRESSION: Lumbar spondylosis as outlined and most notably as follows.  At L5-S1, there is a disc bulge with endplate spurring. Mild facet arthrosis. Bilateral neural foraminal narrowing (mild right, mild/moderate left). No significant spinal canal stenosis. Mixed degenerative endplate  marrow signal at this level including mild degenerative endplate edema.  Posterior annular fissure present at L4-L5.   Electronically Signed   By: Jackey Loge DO   On: 05/24/2019 14:36   PMFS History: Patient Active Problem List   Diagnosis Date Noted  . Family history of premature CAD 05/21/2019  . Prediabetes 05/21/2019  . Irritability 05/21/2019  . Chronic bilateral back pain 03/13/2019  . Right leg pain 03/13/2019  . Snores 09/11/2018  . Other fatigue 09/11/2018  . Impaired fasting blood sugar 09/11/2018  . Irregular periods/menstrual cycles 09/11/2018  . Hot flashes 09/11/2018  . Need for influenza vaccination 05/21/2018  . History of pre-eclampsia 05/21/2018  . Vaccine counseling 05/21/2018  . Preeclampsia in postpartum period 07/20/2014  . S/P cesarean section 07/12/2014  . Preeclampsia 07/11/2014  . Morbid obesity (HCC)   . Pregnancy with poor reproductive history   . Maternal age 60+, primigravida, antepartum   . History of premature rupture of membranes in previous pregnancy, currently pregnant   . HTN in pregnancy, chronic   . Pregnancy complication, habitual aborter   . Evaluate anatomy not seen on prior sonogram   .  History of preterm premature rupture of membranes (PROM) in previous pregnancy, currently pregnant   . Screening for diabetes mellitus   . Prior poor obstetrical history in second trimester, antepartum   . HTN (hypertension) 09/15/2012  . Headache(784.0) 09/15/2012  . Urinary frequency 09/15/2012  . Shortness of breath 09/15/2012  . Unspecified constipation 09/15/2012   Past Medical History:  Diagnosis Date  . Chronic back pain   . GERD (gastroesophageal reflux disease)   . History of pre-eclampsia   . Hypertension   . Pregnancy induced hypertension     Family History  Problem Relation Age of Onset  . High blood pressure Mother   . Heart disease Mother        CHF  . High blood pressure Maternal Aunt   . High blood pressure  Sister   . Heart disease Sister   . Hypertension Sister   . Diabetes Father   . Heart disease Father 70       MI  . Migraines Neg Hx     Past Surgical History:  Procedure Laterality Date  . BREAST SURGERY  2004   reduction  . CESAREAN SECTION N/A 07/12/2014   Procedure: CESAREAN SECTION;  Surgeon: Frederico Hamman, MD;  Location: [facility];  Service: Obstetrics;  Laterality: N/A;  . TUBAL LIGATION  2016   Social History   Occupational History  . Not on file  Tobacco Use  . Smoking status: Light Tobacco Smoker  . Smokeless tobacco: Never Used  Substance and Sexual Activity  . Alcohol use: Yes    Alcohol/week: 2.0 standard drinks    Types: 1 Glasses of wine, 1 Cans of beer per week    Comment: before pregnant  . Drug use: No  . Sexual activity: Not Currently

## 2019-06-04 NOTE — Telephone Encounter (Signed)
Healthgrade review

## 2019-06-14 ENCOUNTER — Other Ambulatory Visit: Payer: Self-pay | Admitting: Medical

## 2019-08-21 ENCOUNTER — Other Ambulatory Visit: Payer: Self-pay | Admitting: Medical

## 2019-12-02 ENCOUNTER — Other Ambulatory Visit: Payer: Self-pay | Admitting: Medical

## 2020-01-08 ENCOUNTER — Other Ambulatory Visit: Payer: Self-pay | Admitting: Medical

## 2020-08-27 ENCOUNTER — Other Ambulatory Visit: Payer: Self-pay | Admitting: Medical

## 2023-04-04 ENCOUNTER — Other Ambulatory Visit (HOSPITAL_BASED_OUTPATIENT_CLINIC_OR_DEPARTMENT_OTHER): Payer: Self-pay

## 2023-04-04 DIAGNOSIS — R0681 Apnea, not elsewhere classified: Secondary | ICD-10-CM

## 2023-04-04 DIAGNOSIS — R0683 Snoring: Secondary | ICD-10-CM

## 2023-04-04 DIAGNOSIS — G4710 Hypersomnia, unspecified: Secondary | ICD-10-CM

## 2023-04-04 DIAGNOSIS — R5383 Other fatigue: Secondary | ICD-10-CM

## 2023-06-28 ENCOUNTER — Emergency Department (HOSPITAL_COMMUNITY)
Admission: EM | Admit: 2023-06-28 | Discharge: 2023-06-29 | Disposition: A | Discharge status: Home / Self Care | Payer: Self-pay | Attending: Emergency Medicine | Admitting: Emergency Medicine

## 2023-06-28 ENCOUNTER — Other Ambulatory Visit: Payer: Self-pay

## 2023-06-28 ENCOUNTER — Encounter (HOSPITAL_COMMUNITY): Payer: Self-pay

## 2023-06-28 ENCOUNTER — Emergency Department (HOSPITAL_COMMUNITY): Payer: Self-pay

## 2023-06-28 DIAGNOSIS — I10 Essential (primary) hypertension: Secondary | ICD-10-CM | POA: Insufficient documentation

## 2023-06-28 DIAGNOSIS — R110 Nausea: Secondary | ICD-10-CM | POA: Insufficient documentation

## 2023-06-28 DIAGNOSIS — Z79899 Other long term (current) drug therapy: Secondary | ICD-10-CM | POA: Insufficient documentation

## 2023-06-28 DIAGNOSIS — R059 Cough, unspecified: Secondary | ICD-10-CM | POA: Insufficient documentation

## 2023-06-28 DIAGNOSIS — R072 Precordial pain: Secondary | ICD-10-CM | POA: Insufficient documentation

## 2023-06-28 DIAGNOSIS — E876 Hypokalemia: Secondary | ICD-10-CM | POA: Insufficient documentation

## 2023-06-28 LAB — BASIC METABOLIC PANEL
Anion gap: 14 (ref 5–15)
BUN: 12 mg/dL (ref 6–20)
CO2: 25 mmol/L (ref 22–32)
Calcium: 8.7 mg/dL — ABNORMAL LOW (ref 8.9–10.3)
Chloride: 99 mmol/L (ref 98–111)
Creatinine, Ser: 0.86 mg/dL (ref 0.44–1.00)
GFR, Estimated: >60 mL/min (ref >60)
Glucose, Bld: 136 mg/dL — ABNORMAL HIGH (ref 70–99)
Potassium: 3.2 mmol/L — ABNORMAL LOW (ref 3.5–5.1)
Sodium: 138 mmol/L (ref 135–145)

## 2023-06-28 LAB — CBC
HCT: 41.1 % (ref 36.0–46.0)
Hemoglobin: 13.8 g/dL (ref 12.0–15.0)
MCH: 30.1 pg (ref 26.0–34.0)
MCHC: 33.6 g/dL (ref 30.0–36.0)
MCV: 89.5 fL (ref 80.0–100.0)
Platelets: 366 10*3/uL (ref 150–400)
RBC: 4.59 MIL/uL (ref 3.87–5.11)
RDW: 13.5 % (ref 11.5–15.5)
WBC: 6 10*3/uL (ref 4.0–10.5)
nRBC: 0 % (ref 0.0–0.2)

## 2023-06-28 LAB — TROPONIN I (HIGH SENSITIVITY): Troponin I (High Sensitivity): 3 ng/L (ref <18)

## 2023-06-28 NOTE — ED Triage Notes (Signed)
 Patient BIB [facility] from home for chest pressure radiating to her back starting a couple days ago that got worse tonight upon standing and worsening with ambulation. Patient also reports some SHOB and cough. 324mg  aspirin taken PTA. 20g L AC, BP 110/75, HR 80, RR 16, 97% RA

## 2023-06-28 NOTE — ED Provider Triage Note (Signed)
 Emergency Medicine Provider Triage Evaluation Note  Jacqueline Rowe , a 53 y.o. female  was evaluated in triage.  Pt complains of chest pain.  Began 3 days ago.  Localized across the chest.  Some radiation to the left shoulder.  No cardiac history outside of hypertension and hypercholesterolemia.  States her mother has CHF and no history of CAD.  Pain is not exertional.  Some nausea, no vomiting or diaphoresis.  No recent illnesses.  Review of Systems  Positive: As above Negative: As above  Physical Exam  BP 127/60   Pulse 80   Temp 98.7 F (37.1 C) (Oral)   Resp 16   Ht 5\' 7"  (1.702 m)   Wt 127.5 kg   SpO2 100%   Breastfeeding No   BMI 44.01 kg/m  Gen:   Awake, no distress   Resp:  Normal effort  MSK:   Moves extremities without difficulty  Other:    Medical Decision Making  Medically screening exam initiated at 9:10 PM.  Appropriate orders placed.  Jacqueline Rowe was informed that the remainder of the evaluation will be completed by another provider, this initial triage assessment does not replace that evaluation, and the importance of remaining in the ED until their evaluation is complete.  Workup initiated   Jacqueline Rowe 06/28/23 2112

## 2023-06-28 NOTE — ED Notes (Signed)
 Pt became extremely aggressive yelling and cussing at this RN after repeatedly asking for water and this RN repeatedly replying to patient that I could not give her any water until the dr cleared her. Pt was in hall  28 and could clearly see that the MD was in rooms 13, then 14, then to her. Pt started coming after this RN threatening to strike this RN. Security was called. MD came out of room 14, cleared this pt to go to lobby and to have water. This RN got the pt water that was then handed to the pt by security. Pt was then wheeled to lobby via security. As pt was wheeled by security pt yelled out "Reita Chard" heard by many witnesses.

## 2023-06-29 LAB — HEPATIC FUNCTION PANEL
ALT: 19 U/L (ref 0–44)
AST: 19 U/L (ref 15–41)
Albumin: 3.4 g/dL — ABNORMAL LOW (ref 3.5–5.0)
Alkaline Phosphatase: 54 U/L (ref 38–126)
Bilirubin, Direct: <0.1 mg/dL (ref 0.0–0.2)
Total Bilirubin: 0.7 mg/dL (ref 0.0–1.2)
Total Protein: 7.2 g/dL (ref 6.5–8.1)

## 2023-06-29 LAB — D-DIMER, QUANTITATIVE: D-Dimer, Quant: <0.27 ug{FEU}/mL (ref 0.00–0.50)

## 2023-06-29 LAB — LIPASE, BLOOD: Lipase: 29 U/L (ref 11–51)

## 2023-06-29 LAB — TROPONIN I (HIGH SENSITIVITY): Troponin I (High Sensitivity): 4 ng/L (ref <18)

## 2023-06-29 MED ORDER — POTASSIUM CHLORIDE CRYS ER 20 MEQ PO TBCR
20.0000 meq | EXTENDED_RELEASE_TABLET | Freq: Once | ORAL | Status: AC
  Administered 2023-06-29: 20 meq via ORAL
  Filled 2023-06-29: qty 1

## 2023-06-29 NOTE — ED Provider Notes (Signed)
 [place] EMERGENCY DEPARTMENT AT [facility] Provider Note   CSN: [id number] Arrival date & time: 06/28/23  2055     History  Chief Complaint  Patient presents with   Chest Pain    Jacqueline Rowe is a 53 y.o. female.  The history is provided by the patient and medical records.  Chest Pain Jacqueline Rowe is a 53 y.o. female who presents to the Emergency Department complaining of chest pain.  She presents to the emergency department for evaluation of 2 to 3 days of chest pain described as a pressure sensation that is across her entire chest as well as across her upper back.  Symptoms have been waxing and waning without fully resolving.  Overall symptoms were mild and then suddenly worsened this evening.  No associated fever.  She does have a mild cough.  No significant shortness of breath.  She has nausea but no vomiting or diarrhea.  Yesterday she did have some left upper quadrant pain, this is now resolved.  She has a history of hypertension, hyperlipidemia.  No personal history of coronary artery disease or DVT/PE.  She does not take any hormones.  No recent surgeries.  She has a family history of CHF in her mother.  No reported injuries.     Home Medications Prior to Admission medications   Medication Sig Start Date End Date Taking? Authorizing Provider  albuterol (VENTOLIN HFA) 108 (90 Base) MCG/ACT inhaler Inhale 2 puffs into the lungs as needed. 05/29/19   Tysinger, Kermit Balo, PA-C  carvedilol (COREG) 25 MG tablet Take 1 tablet (25 mg total) by mouth 2 (two) times daily. 05/29/19   Tysinger, Kermit Balo, PA-C  cloNIDine (CATAPRES) 0.3 MG tablet Take 1 tablet (0.3 mg total) by mouth daily. 05/29/19   Tysinger, Kermit Balo, PA-C  famotidine (PEPCID) 40 MG tablet TAKE 1 TABLET BY MOUTH EVERY DAY IN THE EVENING 05/29/19   Tysinger, Kermit Balo, PA-C  losartan-hydrochlorothiazide (HYZAAR) 50-12.5 MG tablet Take 1 tablet by mouth daily. 50-12.5 05/29/19   Tysinger, Kermit Balo, PA-C   methocarbamol (ROBAXIN) 500 MG tablet Take 1 tablet (500 mg total) by mouth at bedtime as needed for muscle spasms. 03/13/19   Tysinger, Kermit Balo, PA-C  spironolactone (ALDACTONE) 50 MG tablet Take 1 tablet (50 mg total) by mouth daily. 05/29/19   Tysinger, Kermit Balo, PA-C  traMADol (ULTRAM) 50 MG tablet Take 50 mg by mouth as needed. 10/05/17   [provider]      Allergies    Patient has no known allergies.    Review of Systems   Review of Systems  Cardiovascular:  Positive for chest pain.  All other systems reviewed and are negative.   Physical Exam Updated Vital Signs BP 131/82   Pulse 78   Temp 98.3 F (36.8 C)   Resp 19   Ht 5\' 7"  (1.702 m)   Wt 127.5 kg   SpO2 100%   Breastfeeding No   BMI 44.01 kg/m  Physical Exam Vitals and nursing note reviewed.  Constitutional:      Appearance: She is well-developed.  HENT:     Head: Normocephalic and atraumatic.  Cardiovascular:     Rate and Rhythm: Normal rate and regular rhythm.     Heart sounds: No murmur heard. Pulmonary:     Effort: Pulmonary effort is normal. No respiratory distress.     Breath sounds: Normal breath sounds.  Abdominal:     Palpations: Abdomen is soft.  Tenderness: There is no abdominal tenderness. There is no guarding or rebound.  Musculoskeletal:        General: No swelling or tenderness.     Comments: 2+ DP pulses bilaterally  Skin:    General: Skin is warm and dry.  Neurological:     Mental Status: She is alert and oriented to person, place, and time.  Psychiatric:        Behavior: Behavior normal.     ED Results / Procedures / Treatments   Labs (all labs ordered are listed, but only abnormal results are displayed) Labs Reviewed  BASIC METABOLIC PANEL - Abnormal; Notable for the following components:      Result Value   Potassium 3.2 (*)    Glucose, Bld 136 (*)    Calcium 8.7 (*)    All other components within normal limits  HEPATIC FUNCTION PANEL - Abnormal; Notable for  the following components:   Albumin 3.4 (*)    All other components within normal limits  CBC  LIPASE, BLOOD  D-DIMER, QUANTITATIVE  TROPONIN I (HIGH SENSITIVITY)  TROPONIN I (HIGH SENSITIVITY)    EKG EKG Interpretation Date/Time:  Wednesday June 28 2023 21:53:56 EST Ventricular Rate:  79 PR Interval:  176 QRS Duration:  74 QT Interval:  362 QTC Calculation: 415 R Axis:   2  Text Interpretation: Normal sinus rhythm Anterior infarct , age undetermined Abnormal ECG Confirmed by Tilden Fossa [phone]) on 06/28/2023 11:57:02 PM  Radiology DG Chest 2 View Result Date: 06/28/2023 CLINICAL DATA:  Chest pain EXAM: CHEST - 2 VIEW COMPARISON:  04/13/2016 FINDINGS: The heart size and mediastinal contours are within normal limits. Both lungs are clear. The visualized skeletal structures are unremarkable. IMPRESSION: No active cardiopulmonary disease. Electronically Signed   By: Alcide Clever M.D.   On: 06/28/2023 23:53    Procedures Procedures    Medications Ordered in ED Medications  potassium chloride SA (KLOR-CON M) CR tablet 20 mEq (20 mEq Oral Given 06/29/23 0347)    ED Course/ Medical Decision Making/ A&P                                 Medical Decision Making Amount and/or Complexity of Data Reviewed Labs: ordered. Radiology: ordered.  Risk Prescription drug management.   Patient here for several days of chest pain.  EKG is nonischemic and troponins are negative x 2.  Labs significant for mild hypokalemia.  Chest x-ray is negative for acute abnormality-images personally reviewed and interpreted, agree with radiologist interpretation.  She is low risk for PE, D-dimer negative.  Current clinical picture is not consistent with PE.  Current picture is not consistent with ACS, troponins negative x 2.  Discussed with patient unclear source of her chest pain.  Feel she is stable for discharge at this time for PCP follow-up.  Discussed starting ibuprofen, as needed for pain.   Discussed outpatient follow-up and return precautions.        Final Clinical Impression(s) / ED Diagnoses Final diagnoses:  Precordial pain  Hypokalemia    Rx / DC Orders ED Discharge Orders     None         Tilden Fossa, MD 06/29/23 [phone]

## 2024-03-12 ENCOUNTER — Emergency Department (HOSPITAL_COMMUNITY)
Admission: EM | Admit: 2024-03-12 | Discharge: 2024-03-12 | Disposition: A | Discharge status: Home / Self Care | Payer: Self-pay | Attending: Emergency Medicine | Admitting: Emergency Medicine

## 2024-03-12 ENCOUNTER — Emergency Department (HOSPITAL_COMMUNITY): Payer: Self-pay

## 2024-03-12 ENCOUNTER — Other Ambulatory Visit (HOSPITAL_COMMUNITY): Payer: Self-pay

## 2024-03-12 DIAGNOSIS — M5441 Lumbago with sciatica, right side: Secondary | ICD-10-CM | POA: Insufficient documentation

## 2024-03-12 DIAGNOSIS — Z79899 Other long term (current) drug therapy: Secondary | ICD-10-CM | POA: Diagnosis not present

## 2024-03-12 DIAGNOSIS — M545 Low back pain, unspecified: Secondary | ICD-10-CM | POA: Diagnosis present

## 2024-03-12 DIAGNOSIS — B029 Zoster without complications: Secondary | ICD-10-CM | POA: Diagnosis not present

## 2024-03-12 DIAGNOSIS — G8929 Other chronic pain: Secondary | ICD-10-CM

## 2024-03-12 DIAGNOSIS — I10 Essential (primary) hypertension: Secondary | ICD-10-CM | POA: Diagnosis not present

## 2024-03-12 MED ORDER — LIDOCAINE 5 % EX PTCH: 1.0000 | MEDICATED_PATCH | CUTANEOUS | 0 refills | Status: AC

## 2024-03-12 MED ORDER — LOSARTAN POTASSIUM-HCTZ 50-12.5 MG PO TABS: 1.0000 | ORAL_TABLET | Freq: Every day | ORAL | 0 refills | Status: AC

## 2024-03-12 MED ORDER — PREDNISONE 20 MG PO TABS
60.0000 mg | ORAL_TABLET | Freq: Once | ORAL | Status: AC
  Administered 2024-03-12: 60 mg via ORAL
  Filled 2024-03-12: qty 3

## 2024-03-12 MED ORDER — VALACYCLOVIR HCL 500 MG PO TABS
1000.0000 mg | ORAL_TABLET | Freq: Once | ORAL | Status: AC
  Administered 2024-03-12: 1000 mg via ORAL
  Filled 2024-03-12: qty 2

## 2024-03-12 MED ORDER — VALACYCLOVIR HCL 1 G PO TABS: 1000.0000 mg | ORAL_TABLET | Freq: Three times a day (TID) | ORAL | 0 refills | Status: AC

## 2024-03-12 MED ORDER — LIDOCAINE 5 % EX PTCH
2.0000 | MEDICATED_PATCH | CUTANEOUS | Status: DC
  Administered 2024-03-12: 2 via TRANSDERMAL
  Filled 2024-03-12: qty 2

## 2024-03-12 MED ORDER — PREDNISONE 10 MG (21) PO TBPK: ORAL_TABLET | Freq: Every day | ORAL | 0 refills | Status: AC

## 2024-03-12 MED ORDER — CARVEDILOL 12.5 MG PO TABS
25.0000 mg | ORAL_TABLET | Freq: Two times a day (BID) | ORAL | Status: DC
  Administered 2024-03-12: 25 mg via ORAL
  Filled 2024-03-12: qty 2

## 2024-03-12 MED ORDER — TRAMADOL HCL 50 MG PO TABS: 50.0000 mg | ORAL_TABLET | Freq: Four times a day (QID) | ORAL | 0 refills | Status: AC | PRN

## 2024-03-12 MED ORDER — CARVEDILOL 25 MG PO TABS: 25.0000 mg | ORAL_TABLET | Freq: Two times a day (BID) | ORAL | 0 refills | Status: AC

## 2024-03-12 NOTE — ED Triage Notes (Signed)
 Pt arrives via [facility] from home c/o lower back pain L>R worsening over the last month. States pain radiates down her L leg. Pt denies known injury, no reported hx of sciatica.

## 2024-03-12 NOTE — ED Provider Notes (Signed)
 [place] EMERGENCY DEPARTMENT AT [facility] Provider Note   CSN: [id number] Arrival date & time: 03/12/24  1151     Patient presents with: Back Pain   Jacqueline Rowe is a 53 y.o. female presents to the ED today out of concern for low and mid back pain that has been progressively worse over the last 3 days.  She had a fall approximately a month ago, but did not have acute exacerbation of pain until 3 days prior.  Has had chronic pain in the low back over the last several years since childbirth, however this was manageable previously with over-the-counter medications.  Pain is exacerbated with any movement of the torso, worsened with ambulation.  She denies any distal numbness or paresthesia but does have left-sided radicular pain down the left leg into the left knee.  No recent falls more recent than the fall she had 1 month prior, does have history of hypertension which she takes medications however she is unable to consistently take these medications due to insurance concerns.  Is also noted that she has a what she describes as a flare of her [place] on the left side of her back.  Does have rash on the left side of her back that is consistent.    Back Pain      Prior to Admission medications   Medication Sig Start Date End Date Taking? Authorizing Provider  carvedilol  (COREG ) 25 MG tablet Take 1 tablet (25 mg total) by mouth 2 (two) times daily with a meal. 03/12/24 04/11/24 Yes Myriam Dorn BROCKS, PA  lidocaine  (LIDODERM ) 5 % Place 1 patch onto the skin daily. Remove & Discard patch within 12 hours or as directed by MD 03/12/24  Yes Myriam Dorn BROCKS, PA  losartan -hydrochlorothiazide (HYZAAR) 50-12.5 MG tablet Take 1 tablet by mouth daily. 03/12/24  Yes Myriam Dorn BROCKS, PA  predniSONE (STERAPRED UNI-PAK 21 TAB) 10 MG (21) TBPK tablet Take by mouth daily. Take 6 tabs by mouth daily  for 2 days, then 5 tabs for 2 days, then 4 tabs for 2 days, then 3 tabs for 2  days, 2 tabs for 2 days, then 1 tab by mouth daily for 2 days 03/12/24  Yes Myriam Dorn BROCKS, PA  traMADol  (ULTRAM ) 50 MG tablet Take 1 tablet (50 mg total) by mouth every 6 (six) hours as needed. 03/12/24  Yes Myriam Dorn BROCKS, PA  valACYclovir (VALTREX) 1000 MG tablet Take 1 tablet (1,000 mg total) by mouth 3 (three) times daily for 14 days. 03/12/24 03/26/24 Yes Myriam Dorn BROCKS, PA  albuterol  (VENTOLIN  HFA) 108 (90 Base) MCG/ACT inhaler Inhale 2 puffs into the lungs as needed. 05/29/19   Tysinger, Alm RAMAN, PA-C  carvedilol  (COREG ) 25 MG tablet Take 1 tablet (25 mg total) by mouth 2 (two) times daily. 05/29/19   Tysinger, Alm RAMAN, PA-C  cloNIDine  (CATAPRES ) 0.3 MG tablet Take 1 tablet (0.3 mg total) by mouth daily. 05/29/19   Tysinger, Alm RAMAN, PA-C  famotidine  (PEPCID ) 40 MG tablet TAKE 1 TABLET BY MOUTH EVERY DAY IN THE EVENING 05/29/19   Tysinger, Alm RAMAN, PA-C  losartan -hydrochlorothiazide (HYZAAR) 50-12.5 MG tablet Take 1 tablet by mouth daily. 50-12.5 05/29/19   Tysinger, Alm RAMAN, PA-C  methocarbamol  (ROBAXIN ) 500 MG tablet Take 1 tablet (500 mg total) by mouth at bedtime as needed for muscle spasms. 03/13/19   Tysinger, Alm RAMAN, PA-C  spironolactone  (ALDACTONE ) 50 MG tablet Take 1 tablet (50 mg total) by mouth daily. 05/29/19  Tysinger, Alm RAMAN, PA-C    Allergies: Patient has no known allergies.    Review of Systems  Musculoskeletal:  Positive for back pain.    Updated Vital Signs BP (!) 213/106 (BP Location: Right Arm)   Pulse 92   Temp 97.9 F (36.6 C) (Oral)   Resp 20   SpO2 97%   Physical Exam  (all labs ordered are listed, but only abnormal results are displayed) Labs Reviewed - No data to display  EKG: None  Radiology: CT Lumbar Spine Wo Contrast Result Date: 03/12/2024 EXAM: CT OF THE LUMBAR SPINE WITHOUT CONTRAST 03/12/2024 01:29:08 PM TECHNIQUE: CT of the lumbar spine was performed without the administration of intravenous contrast. Multiplanar reformatted images  are provided for review. Automated exposure control, iterative reconstruction, and/or weight based adjustment of the mA/kV was utilized to reduce the radiation dose to as low as reasonably achievable. COMPARISON: MRI lumbar spine 05/24/2019. CLINICAL HISTORY: Low back pain, symptoms persist with > 6 wks treatment. FINDINGS: BONES AND ALIGNMENT: 5 lumbar type vertebrae. Normal vertebral body heights. No acute fracture or suspicious bone lesion. Chronic trace retrolisthesis of L5 on S1. DISC LEVELS: Moderate disc space narrowing, degenerative endplate sclerosis and spurring, and vacuum disc phenomenon at L5-S1 with preserved lumbar disc heights elsewhere. Mild facet arthrosis at L4-L5 and moderate facet arthrosis at L5-S1. No evidence of significant spinal canal stenosis. Progressive, moderate left greater than right neural foraminal stenosis at L5-S1. SOFT TISSUES: No acute abnormality. IMPRESSION: 1. No acute osseous abnormality in the lumbar spine. 2. Disc and facet degeneration at L5-S1 with moderate bilateral neural foraminal stenosis. 3. No evidence of significant spinal canal stenosis. Electronically signed by: Dasie Hamburg MD 03/12/2024 01:51 PM EST RP Workstation: [id number]   CT Thoracic Spine Wo Contrast Result Date: 03/12/2024 EXAM: CT THORACIC SPINE WITHOUT CONTRAST 03/12/2024 01:29:08 PM TECHNIQUE: CT of the thoracic spine was performed without the administration of intravenous contrast. Multiplanar reformatted images are provided for review. Automated exposure control, iterative reconstruction, and/or weight based adjustment of the mA/kV was utilized to reduce the radiation dose to as low as reasonably achievable. COMPARISON: Thoracic spine radiographs 04/13/2016. CLINICAL HISTORY: Mid-back pain, spondyloarthropathy suspected, no prior imaging. FINDINGS: BONES AND ALIGNMENT: Normal vertebral body heights. No acute fracture or suspicious bone lesion. Minimal S-shaped thoracic spine curvature. No  listhesis. DEGENERATIVE CHANGES: Mild diffuse thoracic spondylosis. No evidence of high grade spinal canal or neural foraminal stenosis. SOFT TISSUES: No acute abnormality. IMPRESSION: 1. No acute osseous abnormality in the thoracic spine. 2. Mild diffuse thoracic spondylosis without high-grade stenosis. Electronically signed by: Dasie Hamburg MD 03/12/2024 01:46 PM EST RP Workstation: [id number]     Procedures   Medications Ordered in the ED  lidocaine  (LIDODERM ) 5 % 2 patch (2 patches Transdermal Patch Applied 03/12/24 1308)  carvedilol  (COREG ) tablet 25 mg (25 mg Oral Given 03/12/24 1312)  predniSONE (DELTASONE) tablet 60 mg (60 mg Oral Given 03/12/24 1310)  valACYclovir (VALTREX) tablet 1,000 mg (1,000 mg Oral Given 03/12/24 1310)                                    Medical Decision Making Amount and/or Complexity of Data Reviewed Radiology: ordered.  Risk Prescription drug management.   Medical Decision Making:   KEARI MIU is a 53 y.o. female who presented to the ED today with acute exacerbation of chronic back pain detailed above.     Complete  initial physical exam performed, notably the patient  was alert and oriented no apparent distress however visibly uncomfortable.  She has pain in the lumbar spine, pain to palpation midline as well as left lateral.  Does have a vesicular rash on the left side of her back, distribution approximately T12-L1.SABRA    Reviewed and confirmed nursing documentation for past medical history, family history, social history.    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 Other diagnoses were considered including (but not limited to) acute fracture, subacute fracture, muscular strain. These are considered less likely due to history of present illness and physical exam findings.     Initial Plan:  CT of the L and T-spine.  Await for  spondyloarthropathy, acute fracture. Objective evaluation as below reviewed   Initial Study Results:    Radiology:  All images reviewed independently. Agree with radiology report at this time.   CT Lumbar Spine Wo Contrast Result Date: 03/12/2024 EXAM: CT OF THE LUMBAR SPINE WITHOUT CONTRAST 03/12/2024 01:29:08 PM TECHNIQUE: CT of the lumbar spine was performed without the administration of intravenous contrast. Multiplanar reformatted images are provided for review. Automated exposure control, iterative reconstruction, and/or weight based adjustment of the mA/kV was utilized to reduce the radiation dose to as low as reasonably achievable. COMPARISON: MRI lumbar spine 05/24/2019. CLINICAL HISTORY: Low back pain, symptoms persist with > 6 wks treatment. FINDINGS: BONES AND ALIGNMENT: 5 lumbar type vertebrae. Normal vertebral body heights. No acute fracture or suspicious bone lesion. Chronic trace retrolisthesis of L5 on S1. DISC LEVELS: Moderate disc space narrowing, degenerative endplate sclerosis and spurring, and vacuum disc phenomenon at L5-S1 with preserved lumbar disc heights elsewhere. Mild facet arthrosis at L4-L5 and moderate facet arthrosis at L5-S1. No evidence of significant spinal canal stenosis. Progressive, moderate left greater than right neural foraminal stenosis at L5-S1. SOFT TISSUES: No acute abnormality. IMPRESSION: 1. No acute osseous abnormality in the lumbar spine. 2. Disc and facet degeneration at L5-S1 with moderate bilateral neural foraminal stenosis. 3. No evidence of significant spinal canal stenosis. Electronically signed by: Dasie Hamburg MD 03/12/2024 01:51 PM EST RP Workstation: [id number]   CT Thoracic Spine Wo Contrast Result Date: 03/12/2024 EXAM: CT THORACIC SPINE WITHOUT CONTRAST 03/12/2024 01:29:08 PM TECHNIQUE: CT of the thoracic spine was performed without the administration of intravenous contrast. Multiplanar reformatted images are provided for review. Automated  exposure control, iterative reconstruction, and/or weight based adjustment of the mA/kV was utilized to reduce the radiation dose to as low as reasonably achievable. COMPARISON: Thoracic spine radiographs 04/13/2016. CLINICAL HISTORY: Mid-back pain, spondyloarthropathy suspected, no prior imaging. FINDINGS: BONES AND ALIGNMENT: Normal vertebral body heights. No acute fracture or suspicious bone lesion. Minimal S-shaped thoracic spine curvature. No listhesis. DEGENERATIVE CHANGES: Mild diffuse thoracic spondylosis. No evidence of high grade spinal canal or neural foraminal stenosis. SOFT TISSUES: No acute abnormality. IMPRESSION: 1. No acute osseous abnormality in the thoracic spine. 2. Mild diffuse thoracic spondylosis without high-grade stenosis. Electronically signed by: Dasie Hamburg MD 03/12/2024 01:46 PM EST RP Workstation: [id number]      Reassessment and Plan:   During her visit patient is hypertensive, provided with home medications and reassessed.  She is also given a course of prednisone, Lidoderm  patches.  Reassessed that she has had some improvement with this therapy.  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  At this time we will continue outpatient course of prednisone along with tramadol  for pain, will also prescribe course of Lidoderm  patches for continued pain management.  Referral to be given for neurosurgery.  Prescription sent in for chronic medications for hypertension as well as for her shingles flare.       Final diagnoses:  Chronic midline low back pain with right-sided sciatica  Herpes zoster without complication  Primary hypertension    ED Discharge Orders          Ordered    valACYclovir (VALTREX) 1000 MG tablet  3 times daily        03/12/24 1402    carvedilol   (COREG ) 25 MG tablet  2 times daily with meals        03/12/24 1402    lidocaine  (LIDODERM ) 5 %  Every 24 hours        03/12/24 1402    predniSONE (STERAPRED UNI-PAK 21 TAB) 10 MG (21) TBPK tablet  Daily        03/12/24 1402    losartan -hydrochlorothiazide (HYZAAR) 50-12.5 MG tablet  Daily        03/12/24 1402    traMADol  (ULTRAM ) 50 MG tablet  Every 6 hours PRN        03/12/24 1404               Myriam Dorn BROCKS, PA 03/12/24 1413    Bari Roxie HERO, DO 03/12/24 1417

## 2024-03-12 NOTE — Discharge Instructions (Addendum)
 Continue taking medications as prescribed and follow-up with your primary care, Road follow-up with referral for neurosurgery as provided to follow-up for your lumbar pain.

## 2024-03-21 ENCOUNTER — Emergency Department (HOSPITAL_COMMUNITY)
Admission: EM | Admit: 2024-03-21 | Discharge: 2024-03-21 | Disposition: A | Discharge status: Home / Self Care | Attending: Emergency Medicine | Admitting: Emergency Medicine

## 2024-03-21 ENCOUNTER — Emergency Department (HOSPITAL_COMMUNITY)

## 2024-03-21 ENCOUNTER — Other Ambulatory Visit: Payer: Self-pay

## 2024-03-21 DIAGNOSIS — R072 Precordial pain: Secondary | ICD-10-CM | POA: Insufficient documentation

## 2024-03-21 DIAGNOSIS — I10 Essential (primary) hypertension: Secondary | ICD-10-CM | POA: Diagnosis not present

## 2024-03-21 LAB — CBC
HCT: 41.3 % (ref 36.0–46.0)
Hemoglobin: 13.6 g/dL (ref 12.0–15.0)
MCH: 29.4 pg (ref 26.0–34.0)
MCHC: 32.9 g/dL (ref 30.0–36.0)
MCV: 89.4 fL (ref 80.0–100.0)
Platelets: 355 K/uL (ref 150–400)
RBC: 4.62 MIL/uL (ref 3.87–5.11)
RDW: 13.8 % (ref 11.5–15.5)
WBC: 10.1 K/uL (ref 4.0–10.5)
nRBC: 0 % (ref 0.0–0.2)

## 2024-03-21 LAB — TROPONIN I (HIGH SENSITIVITY)
Troponin I (High Sensitivity): 9 ng/L (ref <18)
Troponin I (High Sensitivity): 8 ng/L (ref <18)

## 2024-03-21 LAB — BASIC METABOLIC PANEL WITH GFR
Anion gap: 12 (ref 5–15)
BUN: 16 mg/dL (ref 6–20)
CO2: 27 mmol/L (ref 22–32)
Calcium: 8.9 mg/dL (ref 8.9–10.3)
Chloride: 99 mmol/L (ref 98–111)
Creatinine, Ser: 0.86 mg/dL (ref 0.44–1.00)
GFR, Estimated: >60 mL/min (ref >60)
Glucose, Bld: 109 mg/dL — ABNORMAL HIGH (ref 70–99)
Potassium: 3.6 mmol/L (ref 3.5–5.1)
Sodium: 138 mmol/L (ref 135–145)

## 2024-03-21 MED ORDER — ASPIRIN 81 MG PO CHEW: 324.0000 mg | CHEWABLE_TABLET | Freq: Once | ORAL | Status: DC

## 2024-03-21 MED ORDER — NITROGLYCERIN 0.4 MG SL SUBL: 0.4000 mg | SUBLINGUAL_TABLET | SUBLINGUAL | Status: DC | PRN

## 2024-03-21 NOTE — ED Notes (Signed)
 Patient transported to X-ray

## 2024-03-21 NOTE — Discharge Instructions (Addendum)
 As discussed, please follow-up with primary care and cardiology.  Seek emergency care if experiencing any new or worsening symptoms.

## 2024-03-21 NOTE — ED Triage Notes (Signed)
 Pt bib EMS with c/o chest pain and jaw pain. Took 4 baby aspirin  before EMS arrived.   EMS VS HR 78 BP 185/112 O2 100% 116 CBG 97.7 F

## 2024-03-21 NOTE — ED Provider Notes (Signed)
 [facility]PT [facility] Emergency Department Provider Note MRN:  [id number]  Arrival date & time: 03/21/24     Chief Complaint   Chest Pain   History of Present Illness   Jacqueline Rowe is a 53 y.o. year-old female presents to the ED with chief complaint of neck pain that radiated from her ear to her chest.  States that she has some pressure in the chest.  States that she initially felt SOB, but not now.  Denies known sick contacts.  Denies any heart disease.  Hx of HTN and pre-diabetes.  Patient took 324 ASA PTA.  History provided by patient.   Review of Systems  Pertinent positive and negative review of systems noted in HPI.    Physical Exam   Vitals:   03/21/24 0725 03/21/24 0739  BP:  (!) 159/90  Pulse:  91  Resp:  18  Temp: 97.7 F (36.5 C)   SpO2:  99%    CONSTITUTIONAL:  non toxic-appearing, NAD NEURO:  Alert and oriented x 3, CN 3-12 grossly intact EYES:  eyes equal and reactive ENT/NECK:  Supple, no stridor  CARDIO:  normal rate, regular rhythm, appears well-perfused  PULM:  No respiratory distress, CTAB GI/GU:  non-distended,  MSK/SPINE:  No gross deformities, no edema, moves all extremities  SKIN:  no rash, atraumatic   *Additional and/or pertinent findings included in MDM below  Diagnostic and Interventional Summary    EKG Interpretation Date/Time:  Thursday March 21 2024 03:10:56 EST Ventricular Rate:  78 PR Interval:  155 QRS Duration:  89 QT Interval:  379 QTC Calculation: 432 R Axis:   45  Text Interpretation: Sinus rhythm Probable left atrial enlargement Low voltage, precordial leads Confirmed by Lorette Mayo [phone]) on 03/21/2024 5:04:57 AM       Labs Reviewed  BASIC METABOLIC PANEL WITH GFR - Abnormal; Notable for the following components:      Result Value   Glucose, Bld 109 (*)    All other components within normal limits  CBC  TROPONIN I (HIGH SENSITIVITY)  TROPONIN I (HIGH SENSITIVITY)    DG Chest 2  View  Final Result      Medications - No data to display   Procedures  /  Critical Care Procedures  ED Course and Medical Decision Making  I have reviewed the triage vital signs, the nursing notes, and pertinent available records from the EMR.  Social Determinants Affecting Complexity of Care: Patient has no clinically significant social determinants affecting this chief complaint..   ED Course:    Medical Decision Making Patient here with chest pain and pressure that radiated to her neck.  She had some shortness of breath.  States that symptoms have now resolved completely.  She did take aspirin  prior to arrival.  She denies any heart problems.  Denies any recent illnesses.  Workup thus far is reassuring.  No significant electrolyte abnormality.  No leukocytosis or anemia.  Initial troponin is 9, will check repeat.  Chest x-ray negative for acute process.  EKG is without acute ischemic changes.  Symptoms just started after patient had lie down to go to bed, my initial suspicion for ACS is lower given that her initial troponin and EKG are reassuring.  Could also consider GI/GERD.  Repeat troponin is pending.  If patient remains pain-free, feel that she could be safely discharged with outpatient cardiology follow-up.  Patient signed out to oncoming team.  Amount and/or Complexity of Data Reviewed Labs: ordered. Radiology: ordered.  Risk Prescription drug management.         Consultants:    Treatment and Plan: Per oncoming team    Final Clinical Impressions(s) / ED Diagnoses     ICD-10-CM   1. Precordial pain  R07.2 Ambulatory referral to Cardiology      ED Discharge Orders          Ordered    Ambulatory referral to Cardiology       Comments: If you have not heard from the Cardiology office within the next 72 hours please call [phone].   03/21/24 0737              Discharge Instructions Discussed with and Provided to Patient:     Discharge  Instructions      As discussed, please follow-up with primary care and cardiology.  Seek emergency care if experiencing any new or worsening symptoms.       Vicky Charleston, PA-C 03/21/24 2159    Lorette Mayo, MD 03/22/24 [phone]

## 2024-03-21 NOTE — ED Provider Notes (Signed)
 Accepted handoff at shift change from [employer]. Please see prior provider note for more detail.   Briefly: Patient is 53 y.o. who presents to ED concerned for right neck pain extending up to right ear and down into left chest. Pain occurred while patient was laying down.  Pain was 10/10 in severity for a couple of minutes and patient experienced SOB during that time d/t the pain when taking deep inspirations. Patient took ASA and pain quickly went from 10/10 to 4/10 by the time EMS arrived. Pain has since resolved since being in the ED and has not recurred. No other medications were given/taken. Patient denies recent surgery/immobilization, hx DVT/PE, hemoptysis, hx cancer in the past 6 months, calf swelling/tenderness. Denies hormone use. Patient with a smoking hx but quit 3 months ago. Physical exam without signs or symptoms of DVT. Patient without tachycardia or hypoxia and is currently asymptomatic.   Plan:  - dispo pending repeat trop - possible cardiology follow up if delta trop is reassuring. - Delta troponin reassuring.  CBC without leukocytosis or anemia.  BMP with mild hyperglycemia 109.  Chest x-ray without acute cardiopulmonary disease.  EKG sinus rhythm. -Doubt emergent process at this time such as AMI vs PE given that patient is currently asymptomatic and labs are reassuring today. WELLS PE score 0. I shared all results with patient.  Answered all questions.  Patient is also reassured and ready to go home.  I will provide her with a cardiology referral.  Patient agreeable with plan. -Patient is afebrile with stable vitals.  Provided with return precautions.  Discharged in good condition.   Hoy Nidia FALCON, [place] 03/21/24 [id number]    Lorette Mayo, MD 03/22/24 [id number]
